# Patient Record
Sex: Male | Born: 1960
Health system: Southern US, Community
[De-identification: ages and names within clinical notes are randomized; demographics above are authoritative.]

## PROBLEM LIST (undated history)

## (undated) DIAGNOSIS — R030 Elevated blood-pressure reading, without diagnosis of hypertension: Secondary | ICD-10-CM

## (undated) DIAGNOSIS — C801 Malignant (primary) neoplasm, unspecified: Secondary | ICD-10-CM

## (undated) DIAGNOSIS — E7800 Pure hypercholesterolemia, unspecified: Secondary | ICD-10-CM

## (undated) DIAGNOSIS — K649 Unspecified hemorrhoids: Secondary | ICD-10-CM

## (undated) DIAGNOSIS — J45909 Unspecified asthma, uncomplicated: Secondary | ICD-10-CM

## (undated) DIAGNOSIS — L309 Dermatitis, unspecified: Secondary | ICD-10-CM

## (undated) HISTORY — DX: Elevated blood-pressure reading, without diagnosis of hypertension: R03.0

## (undated) HISTORY — DX: Dermatitis, unspecified: L30.9

## (undated) HISTORY — PX: PROSTATE BIOPSY: SHX241

## (undated) HISTORY — DX: Pure hypercholesterolemia, unspecified: E78.00

## (undated) HISTORY — PX: OTHER SURGICAL HISTORY: SHX169

## (undated) HISTORY — DX: Unspecified hemorrhoids: K64.9

---

## 2003-11-26 ENCOUNTER — Ambulatory Visit (HOSPITAL_COMMUNITY): Admission: RE | Admit: 2003-11-26 | Discharge: 2003-11-26 | Payer: Self-pay | Admitting: Allergy

## 2007-04-27 ENCOUNTER — Ambulatory Visit (HOSPITAL_COMMUNITY): Admission: RE | Admit: 2007-04-27 | Discharge: 2007-04-27 | Payer: Self-pay | Admitting: Surgery

## 2007-04-27 ENCOUNTER — Encounter (INDEPENDENT_AMBULATORY_CARE_PROVIDER_SITE_OTHER): Payer: Self-pay | Admitting: Surgery

## 2010-08-13 NOTE — Op Note (Signed)
NAMETANVIR, David Olsen                ACCOUNT NO.:  [id number]   MEDICAL RECORD NO.:  [id number]          PATIENT TYPE:  AMB   LOCATION:  DAY                          FACILITY:  [facility]   PHYSICIAN:  Wilmon Arms. Corliss Skains, M.D. DATE OF BIRTH:  [birth year]   DATE OF PROCEDURE:  04/27/2007  DATE OF DISCHARGE:                               OPERATIVE REPORT   PREOPERATIVE DIAGNOSIS:  External hemorrhoidal skin tag.   POSTOPERATIVE DIAGNOSIS:  External hemorrhoidal skin tag.   PROCEDURE:  Excision of external hemorrhoidal skin tag.   SURGEON:  Wilmon Arms. Corliss Skains, M.D., FACS   ANESTHESIA:  General.   INDICATIONS:  The patient is a 50 year old male who previously had a  large external hemorrhoid.  This has developed into a large external  skin tag which interferes with his hygiene.  This causes some pruritus  ani when he has difficulty with hygiene.  He presents for elective  removal of the skin tag.   DESCRIPTION OF PROCEDURE:  The patient brought to the operating room,  placed in supine position on the operating table.  After an adequate  level of general anesthesia was obtained, the patient's legs were placed  in lithotomy position in yellow fin stirrups.  His perineum was prepped  with Betadine and draped in sterile fashion.  Time-out was taken assure  proper patient, proper procedure.  The area around the sphincter was  infiltrated with 10 mL of 0.25% Marcaine with epinephrine.  His anus was  then dilated up to three fingers.  I inserted a silver bullet retractor.  There was an obvious external hemorrhoidal skin tag anteriorly.  There  really seemed to be no other significant hemorrhoidal disease.  The skin  tag was grasped with forceps and was amputated at its base.  The mucosal  edges were reapproximated with a figure-of-eight 3-0 Vicryl.  A small  piece of Gelfoam was packed in the anal canal.  Retractor was removed.  The patient was then extubated, brought to recovery room in stable  condition.      Wilmon Arms. Tsuei, M.D.  Electronically Signed     MKT/MEDQ  D:  04/27/2007  T:  04/27/2007  Job:  [id number]   cc:   Vikki Ports, M.D.  Fax: [phone]

## 2011-04-09 ENCOUNTER — Other Ambulatory Visit: Payer: Self-pay | Admitting: Allergy

## 2011-04-09 ENCOUNTER — Ambulatory Visit
Admission: RE | Admit: 2011-04-09 | Discharge: 2011-04-09 | Disposition: A | Discharge status: Home / Self Care | Payer: BC Managed Care – PPO | Source: Ambulatory Visit | Attending: Allergy | Admitting: Allergy

## 2011-04-09 DIAGNOSIS — J209 Acute bronchitis, unspecified: Secondary | ICD-10-CM

## 2013-09-01 ENCOUNTER — Inpatient Hospital Stay (HOSPITAL_COMMUNITY)
Admission: EM | Admit: 2013-09-01 | Discharge: 2013-09-04 | DRG: 373 | Disposition: A | Discharge status: Home / Self Care | Payer: BC Managed Care – PPO | Attending: Surgery | Admitting: Surgery

## 2013-09-01 ENCOUNTER — Emergency Department (HOSPITAL_COMMUNITY): Payer: BC Managed Care – PPO

## 2013-09-01 ENCOUNTER — Encounter (HOSPITAL_COMMUNITY): Payer: Self-pay | Admitting: Emergency Medicine

## 2013-09-01 DIAGNOSIS — K35209 Acute appendicitis with generalized peritonitis, without abscess, unspecified as to perforation: Principal | ICD-10-CM | POA: Diagnosis present

## 2013-09-01 DIAGNOSIS — Z8249 Family history of ischemic heart disease and other diseases of the circulatory system: Secondary | ICD-10-CM

## 2013-09-01 DIAGNOSIS — Z79899 Other long term (current) drug therapy: Secondary | ICD-10-CM

## 2013-09-01 DIAGNOSIS — K3532 Acute appendicitis with perforation and localized peritonitis, without abscess: Secondary | ICD-10-CM | POA: Diagnosis present

## 2013-09-01 DIAGNOSIS — J45909 Unspecified asthma, uncomplicated: Secondary | ICD-10-CM | POA: Diagnosis present

## 2013-09-01 DIAGNOSIS — K352 Acute appendicitis with generalized peritonitis, without abscess: Principal | ICD-10-CM | POA: Diagnosis present

## 2013-09-01 DIAGNOSIS — F17200 Nicotine dependence, unspecified, uncomplicated: Secondary | ICD-10-CM | POA: Diagnosis present

## 2013-09-01 HISTORY — DX: Unspecified asthma, uncomplicated: J45.909

## 2013-09-01 LAB — CBC WITH DIFFERENTIAL/PLATELET
Basophils Absolute: 0.1 10*3/uL (ref 0.0–0.1)
Basophils Relative: 1 % (ref 0–1)
EOS PCT: 6 % — AB (ref 0–5)
Eosinophils Absolute: 0.5 10*3/uL (ref 0.0–0.7)
HCT: 42.6 % (ref 39.0–52.0)
Hemoglobin: 14.5 g/dL (ref 13.0–17.0)
LYMPHS ABS: 1.5 10*3/uL (ref 0.7–4.0)
LYMPHS PCT: 15 % (ref 12–46)
MCH: 32.6 pg (ref 26.0–34.0)
MCHC: 34 g/dL (ref 30.0–36.0)
MCV: 95.7 fL (ref 78.0–100.0)
MONO ABS: 1.8 10*3/uL — AB (ref 0.1–1.0)
Monocytes Relative: 19 % — ABNORMAL HIGH (ref 3–12)
NEUTROS ABS: 5.9 10*3/uL (ref 1.7–7.7)
Neutrophils Relative %: 59 % (ref 43–77)
PLATELETS: 242 10*3/uL (ref 150–400)
RBC: 4.45 MIL/uL (ref 4.22–5.81)
RDW: 12.2 % (ref 11.5–15.5)
WBC: 9.8 10*3/uL (ref 4.0–10.5)

## 2013-09-01 LAB — COMPREHENSIVE METABOLIC PANEL
ALT: 11 U/L (ref 0–53)
AST: 16 U/L (ref 0–37)
Albumin: 3.4 g/dL — ABNORMAL LOW (ref 3.5–5.2)
Alkaline Phosphatase: 63 U/L (ref 39–117)
BILIRUBIN TOTAL: 0.4 mg/dL (ref 0.3–1.2)
BUN: 11 mg/dL (ref 6–23)
CHLORIDE: 99 meq/L (ref 96–112)
CO2: 24 mEq/L (ref 19–32)
Calcium: 9.2 mg/dL (ref 8.4–10.5)
Creatinine, Ser: 0.95 mg/dL (ref 0.50–1.35)
GLUCOSE: 126 mg/dL — AB (ref 70–99)
Potassium: 4 mEq/L (ref 3.7–5.3)
SODIUM: 136 meq/L — AB (ref 137–147)
TOTAL PROTEIN: 6.9 g/dL (ref 6.0–8.3)

## 2013-09-01 LAB — URINALYSIS, ROUTINE W REFLEX MICROSCOPIC
BILIRUBIN URINE: NEGATIVE
Glucose, UA: NEGATIVE mg/dL
HGB URINE DIPSTICK: NEGATIVE
KETONES UR: NEGATIVE mg/dL
Leukocytes, UA: NEGATIVE
NITRITE: NEGATIVE
PH: 6.5 (ref 5.0–8.0)
Protein, ur: NEGATIVE mg/dL
Specific Gravity, Urine: 1.004 — ABNORMAL LOW (ref 1.005–1.030)
Urobilinogen, UA: 0.2 mg/dL (ref 0.0–1.0)

## 2013-09-01 LAB — LIPASE, BLOOD: LIPASE: 12 U/L (ref 11–59)

## 2013-09-01 MED ORDER — MOMETASONE FURO-FORMOTEROL FUM 100-5 MCG/ACT IN AERO
2.0000 | INHALATION_SPRAY | Freq: Two times a day (BID) | RESPIRATORY_TRACT | Status: DC
  Administered 2013-09-01 – 2013-09-04 (×6): 2 via RESPIRATORY_TRACT
  Filled 2013-09-01: qty 8.8

## 2013-09-01 MED ORDER — SODIUM CHLORIDE 0.9 % IV BOLUS (SEPSIS)
1000.0000 mL | Freq: Once | INTRAVENOUS | Status: AC
  Administered 2013-09-01: 1000 mL via INTRAVENOUS

## 2013-09-01 MED ORDER — ENOXAPARIN SODIUM 40 MG/0.4ML ~~LOC~~ SOLN
40.0000 mg | SUBCUTANEOUS | Status: DC
  Administered 2013-09-01 – 2013-09-03 (×3): 40 mg via SUBCUTANEOUS
  Filled 2013-09-01 (×4): qty 0.4

## 2013-09-01 MED ORDER — ONDANSETRON HCL 4 MG/2ML IJ SOLN: 4.0000 mg | Freq: Four times a day (QID) | INTRAMUSCULAR | Status: DC | PRN

## 2013-09-01 MED ORDER — KCL-LACTATED RINGERS-D5W 20 MEQ/L IV SOLN
INTRAVENOUS | Status: DC
  Administered 2013-09-01: 21:00:00 via INTRAVENOUS
  Administered 2013-09-02: 1000 mL via INTRAVENOUS
  Administered 2013-09-04: 03:00:00 via INTRAVENOUS
  Filled 2013-09-01 (×6): qty 1000

## 2013-09-01 MED ORDER — PIPERACILLIN-TAZOBACTAM 3.375 G IVPB 30 MIN
3.3750 g | Freq: Once | INTRAVENOUS | Status: AC
  Administered 2013-09-01: 3.375 g via INTRAVENOUS
  Filled 2013-09-01: qty 50

## 2013-09-01 MED ORDER — IOHEXOL 300 MG/ML  SOLN
100.0000 mL | Freq: Once | INTRAMUSCULAR | Status: AC | PRN
  Administered 2013-09-01: 100 mL via INTRAVENOUS

## 2013-09-01 MED ORDER — MORPHINE SULFATE 2 MG/ML IJ SOLN: 2.0000 mg | INTRAMUSCULAR | Status: DC | PRN

## 2013-09-01 MED ORDER — ONDANSETRON HCL 4 MG/2ML IJ SOLN: 4.0000 mg | Freq: Three times a day (TID) | INTRAMUSCULAR | Status: DC | PRN

## 2013-09-01 MED ORDER — IOHEXOL 300 MG/ML  SOLN
50.0000 mL | Freq: Once | INTRAMUSCULAR | Status: AC | PRN
  Administered 2013-09-01: 50 mL via ORAL

## 2013-09-01 MED ORDER — PIPERACILLIN-TAZOBACTAM 3.375 G IVPB
3.3750 g | Freq: Three times a day (TID) | INTRAVENOUS | Status: DC
  Administered 2013-09-02 – 2013-09-04 (×7): 3.375 g via INTRAVENOUS
  Filled 2013-09-01 (×9): qty 50

## 2013-09-01 MED ORDER — PANTOPRAZOLE SODIUM 40 MG IV SOLR
40.0000 mg | Freq: Every day | INTRAVENOUS | Status: DC
Start: 2013-09-01 — End: 2013-09-04
  Administered 2013-09-02 – 2013-09-03 (×3): 40 mg via INTRAVENOUS
  Filled 2013-09-01 (×4): qty 40

## 2013-09-01 NOTE — H&P (Signed)
David Olsen is an 53 y.o. male.   Chief Complaint: Persistent right lower quadrant abdominal pain HPI: He was in his normal state of health until 4 days ago when he felt a little bit of bloating and had 2 loose bowel movements. He awoke the morning after this with crampy lower abdominal pain that eventually radiated to the right lower quadrant and increased in intensity. He took some Imodium and this seemed to help some.  2 days ago, the pain became worse and was associated with some chills and a subjective fever. No diarrhea. Mild discomfort with urination. Yesterday, he started to feel better but did arrange to go see his primary care physician today who was concerned about appendicitis. White blood cell count was normal in the office. CT scan demonstrated a right lower quadrant inflammatory phlegmon with the appendix in the middle of it suggesting a contained perforated appendicitis. He is not in severe pain at this time. He has been eating some.    Past Medical History  Diagnosis Date  . Asthma     History reviewed. No pertinent past surgical history.  Family History  Problem Relation Age of Onset  . Cancer Mother   . Coronary artery disease Father    Social History:  reports that he has been smoking Cigars.  He does not have any smokeless tobacco history on file. He reports that he drinks alcohol. His drug history is not on file.  Allergies: No Known Allergies  Prior to Admission medications   Medication Sig Start Date End Date Taking? Authorizing Provider  fexofenadine (ALLEGRA) 180 MG tablet Take 180 mg by mouth at bedtime.   Yes Historical Provider, MD  Fluticasone-Salmeterol (ADVAIR) 250-50 MCG/DOSE AEPB Inhale 1 puff into the lungs 2 (two) times daily.   Yes Historical Provider, MD  glucosamine-chondroitin 500-400 MG tablet Take 1 tablet by mouth daily.   Yes Historical Provider, MD  Multiple Vitamins-Minerals (MULTIVITAMIN WITH MINERALS) tablet Take 1 tablet by mouth daily.    Yes Historical Provider, MD  omega-3 acid ethyl esters (LOVAZA) 1 G capsule Take 1 g by mouth daily.   Yes Historical Provider, MD  Probiotic Product (PROBIOTIC DAILY PO) Take 1 tablet by mouth daily.   Yes Historical Provider, MD      (Not in a hospital admission)  Results for orders placed during the hospital encounter of 09/01/13 (from the past 48 hour(s))  CBC WITH DIFFERENTIAL     Status: Abnormal   Collection Time    09/01/13  4:15 PM      Result Value Ref Range   WBC 9.8  4.0 - 10.5 K/uL   RBC 4.45  4.22 - 5.81 MIL/uL   Hemoglobin 14.5  13.0 - 17.0 g/dL   HCT 42.6  39.0 - 52.0 %   MCV 95.7  78.0 - 100.0 fL   MCH 32.6  26.0 - 34.0 pg   MCHC 34.0  30.0 - 36.0 g/dL   RDW 12.2  11.5 - 15.5 %   Platelets 242  150 - 400 K/uL   Neutrophils Relative % 59  43 - 77 %   Neutro Abs 5.9  1.7 - 7.7 K/uL   Lymphocytes Relative 15  12 - 46 %   Lymphs Abs 1.5  0.7 - 4.0 K/uL   Monocytes Relative 19 (*) 3 - 12 %   Monocytes Absolute 1.8 (*) 0.1 - 1.0 K/uL   Eosinophils Relative 6 (*) 0 - 5 %   Eosinophils Absolute 0.5  0.0 - 0.7 K/uL   Basophils Relative 1  0 - 1 %   Basophils Absolute 0.1  0.0 - 0.1 K/uL  COMPREHENSIVE METABOLIC PANEL     Status: Abnormal   Collection Time    09/01/13  4:15 PM      Result Value Ref Range   Sodium 136 (*) 137 - 147 mEq/L   Potassium 4.0  3.7 - 5.3 mEq/L   Chloride 99  96 - 112 mEq/L   CO2 24  19 - 32 mEq/L   Glucose, Bld 126 (*) 70 - 99 mg/dL   BUN 11  6 - 23 mg/dL   Creatinine, Ser 0.95  0.50 - 1.35 mg/dL   Calcium 9.2  8.4 - 10.5 mg/dL   Total Protein 6.9  6.0 - 8.3 g/dL   Albumin 3.4 (*) 3.5 - 5.2 g/dL   AST 16  0 - 37 U/L   ALT 11  0 - 53 U/L   Alkaline Phosphatase 63  39 - 117 U/L   Total Bilirubin 0.4  0.3 - 1.2 mg/dL   GFR calc non Af Amer >90  >90 mL/min   GFR calc Af Amer >90  >90 mL/min   Comment: (NOTE)     The eGFR has been calculated using the CKD EPI equation.     This calculation has not been validated in all clinical  situations.     eGFR's persistently <90 mL/min signify possible Chronic Kidney     Disease.  LIPASE, BLOOD     Status: None   Collection Time    09/01/13  4:15 PM      Result Value Ref Range   Lipase 12  11 - 59 U/L  URINALYSIS, ROUTINE W REFLEX MICROSCOPIC     Status: Abnormal   Collection Time    09/01/13  5:00 PM      Result Value Ref Range   Color, Urine YELLOW  YELLOW   APPearance CLEAR  CLEAR   Specific Gravity, Urine 1.004 (*) 1.005 - 1.030   pH 6.5  5.0 - 8.0   Glucose, UA NEGATIVE  NEGATIVE mg/dL   Hgb urine dipstick NEGATIVE  NEGATIVE   Bilirubin Urine NEGATIVE  NEGATIVE   Ketones, ur NEGATIVE  NEGATIVE mg/dL   Protein, ur NEGATIVE  NEGATIVE mg/dL   Urobilinogen, UA 0.2  0.0 - 1.0 mg/dL   Nitrite NEGATIVE  NEGATIVE   Leukocytes, UA NEGATIVE  NEGATIVE   Comment: MICROSCOPIC NOT DONE ON URINES WITH NEGATIVE PROTEIN, BLOOD, LEUKOCYTES, NITRITE, OR GLUCOSE <1000 mg/dL.   Ct Abdomen Pelvis W Contrast  09/01/2013   CLINICAL DATA:  53 year old male with right abdominal and pelvic pain  EXAM: CT ABDOMEN AND PELVIS WITH CONTRAST  TECHNIQUE: Multidetector CT imaging of the abdomen and pelvis was performed using the standard protocol following bolus administration of intravenous contrast.  CONTRAST:  100 cc intravenous Omnipaque 300  COMPARISON:  None.  FINDINGS: A thickened appendix is present with moderate-large amount of adjacent inflammation/ possible fluid.  The terminal ileum is unremarkable.  There is no evidence of bowel obstruction or pneumoperitoneum.  Cyst at the dome of the liver is noted.  The spleen, pancreas, gallbladder, adrenal glands and kidneys are unremarkable except for a right upper pole renal scar.  The bladder is unremarkable.  No other bowel abnormalities are identified.  No acute or suspicious bony abnormalities are present.  IMPRESSION: Thickened appendix with moderate-large amount of adjacent inflammation/possible fluid compatible with appendicitis with possible  rupture.  No evidence of pneumoperitoneum or bowel obstruction.   Electronically Signed   By: Hassan Rowan M.D.   On: 09/01/2013 18:11    Review of Systems  Constitutional: Positive for fever, chills and malaise/fatigue.  HENT: Negative.   Respiratory: Negative.   Cardiovascular: Negative.   Gastrointestinal: Positive for abdominal pain. Negative for blood in stool.  Genitourinary: Positive for dysuria. Negative for hematuria.  Musculoskeletal: Positive for back pain.  Endo/Heme/Allergies: Does not bruise/bleed easily.    Blood pressure 147/80, pulse 61, temperature 98.3 F (36.8 C), temperature source Oral, resp. rate 16, SpO2 99.00%. Physical Exam  Constitutional: He appears well-developed and well-nourished. No distress.  HENT:  Head: Normocephalic and atraumatic.  Eyes: EOM are normal. No scleral icterus.  Neck: Neck supple.  Cardiovascular: Normal rate and regular rhythm.   Respiratory: Effort normal and breath sounds normal.  GI: Soft. Bowel sounds are normal. He exhibits no mass. There is tenderness (Mild in RLQ). There is no guarding.  Musculoskeletal: He exhibits no edema.  Lymphadenopathy:    He has no cervical adenopathy.  Neurological: He is alert.  Skin: Skin is warm and dry.  Psychiatric: He has a normal mood and affect. His behavior is normal.     Assessment/Plan Contained, perforated appendicitis versus perforated cecal diverticulitis or tumor. I favor the perforated, contained appendicitis. He has no signs of peritonitis or systemic inflammatory response.  Plan: Admit to the hospital. Broad-spectrum IV antibiotics. Interval repeat CT. If he responds to antibiotic treatment, would wait 6-8 weeks and perform a colonoscopy and potentially discuss interval appendectomy.  Rhunette Croft Evaluna Utke 09/01/2013, 7:36 PM

## 2013-09-01 NOTE — ED Notes (Signed)
Provided Pt with urinal and explained we needed a sample. Asked pt with notify when he voided

## 2013-09-01 NOTE — ED Notes (Signed)
Nicole PA at bedside   

## 2013-09-01 NOTE — ED Provider Notes (Signed)
CSN: [id number]     Arrival date & time 09/01/13  1525 History   First MD Initiated Contact with Patient 09/01/13 1534     Chief Complaint  Patient presents with  . Abdominal Pain    lower r/abdominal pain, decreased from Tuesday     (Consider location/radiation/quality/duration/timing/severity/associated sxs/prior Treatment) HPI  David Olsen is a 53 y.o. male complaining of right-sided abdominal pain onsets 2 days ago 9/10 at worst associated with reduced by mouth intake and tactile fever, pain has improved over the course of 2 days and is now minimal. Sent by primary care from the goal for appendicitis evaluation, mild white blood cell count of 10.6. Patient denies any shortness of breath change in bladder habits, he does have a normal bowel movement however he is reduced by mouth intake. Last oral intake was lunch at 12:30. No prior abdominal surgeries.   Past Medical History  Diagnosis Date  . Asthma    History reviewed. No pertinent past surgical history. Family History  Problem Relation Age of Onset  . Cancer Mother   . Coronary artery disease Father    History  Substance Use Topics  . Smoking status: Light Tobacco Smoker    Types: Cigars  . Smokeless tobacco: Not on file  . Alcohol Use: Yes     Comment: occ    Review of Systems  10 systems reviewed and found to be negative, except as noted in the HPI.  Allergies  Review of patient's allergies indicates no known allergies.  Home Medications   Prior to Admission medications   Medication Sig Start Date End Date Taking? Authorizing Provider  fexofenadine (ALLEGRA) 180 MG tablet Take 180 mg by mouth at bedtime.   Yes Historical Provider, MD  Fluticasone-Salmeterol (ADVAIR) 250-50 MCG/DOSE AEPB Inhale 1 puff into the lungs 2 (two) times daily.   Yes Historical Provider, MD  glucosamine-chondroitin 500-400 MG tablet Take 1 tablet by mouth daily.   Yes Historical Provider, MD  Multiple Vitamins-Minerals (MULTIVITAMIN  WITH MINERALS) tablet Take 1 tablet by mouth daily.   Yes Historical Provider, MD  omega-3 acid ethyl esters (LOVAZA) 1 G capsule Take 1 g by mouth daily.   Yes Historical Provider, MD  Probiotic Product (PROBIOTIC DAILY PO) Take 1 tablet by mouth daily.   Yes Historical Provider, MD   BP 147/80  Pulse 61  Temp(Src) 98.3 F (36.8 C) (Oral)  Resp 16  SpO2 99% Physical Exam  Nursing note and vitals reviewed. Constitutional: He is oriented to person, place, and time. He appears well-developed and well-nourished. No distress.  Healthy  HENT:  Head: Normocephalic and atraumatic.  Mouth/Throat: Oropharynx is clear and moist.  Eyes: Conjunctivae and EOM are normal. Pupils are equal, round, and reactive to light.  Neck: Normal range of motion. Neck supple.  Cardiovascular: Normal rate, regular rhythm and intact distal pulses.   Pulmonary/Chest: Effort normal and breath sounds normal. No stridor. No respiratory distress. He has no wheezes. He has no rales. He exhibits no tenderness.  Abdominal: Soft. He exhibits no distension and no mass. There is tenderness. There is rebound and guarding.  Hyperactive bowel sounds, tender in the right lower quadrant, Rovsing is positive, but psoas and obturator are negative. Mild voluntary guarding  Musculoskeletal: Normal range of motion.  Neurological: He is alert and oriented to person, place, and time.  Psychiatric: He has a normal mood and affect.    ED Course  Procedures (including critical care time) Labs Review Labs Reviewed  CBC WITH DIFFERENTIAL - Abnormal; Notable for the following:    Monocytes Relative 19 (*)    Monocytes Absolute 1.8 (*)    Eosinophils Relative 6 (*)    All other components within normal limits  COMPREHENSIVE METABOLIC PANEL - Abnormal; Notable for the following:    Sodium 136 (*)    Glucose, Bld 126 (*)    Albumin 3.4 (*)    All other components within normal limits  URINALYSIS, ROUTINE W REFLEX MICROSCOPIC -  Abnormal; Notable for the following:    Specific Gravity, Urine 1.004 (*)    All other components within normal limits  LIPASE, BLOOD    Imaging Review Ct Abdomen Pelvis W Contrast  09/01/2013   CLINICAL DATA:  53 year old male with right abdominal and pelvic pain  EXAM: CT ABDOMEN AND PELVIS WITH CONTRAST  TECHNIQUE: Multidetector CT imaging of the abdomen and pelvis was performed using the standard protocol following bolus administration of intravenous contrast.  CONTRAST:  100 cc intravenous Omnipaque 300  COMPARISON:  None.  FINDINGS: A thickened appendix is present with moderate-large amount of adjacent inflammation/ possible fluid.  The terminal ileum is unremarkable.  There is no evidence of bowel obstruction or pneumoperitoneum.  Cyst at the dome of the liver is noted.  The spleen, pancreas, gallbladder, adrenal glands and kidneys are unremarkable except for a right upper pole renal scar.  The bladder is unremarkable.  No other bowel abnormalities are identified.  No acute or suspicious bony abnormalities are present.  IMPRESSION: Thickened appendix with moderate-large amount of adjacent inflammation/possible fluid compatible with appendicitis with possible rupture. No evidence of pneumoperitoneum or bowel obstruction.   Electronically Signed   By: Laveda Abbe M.D.   On: 09/01/2013 18:11     EKG Interpretation None      MDM   Final diagnoses:  Perforated appendicitis    Filed Vitals:   09/01/13 1536 09/01/13 1830  BP: 137/73 147/80  Pulse: 63 61  Temp: 98.6 F (37 C) 98.3 F (36.8 C)  TempSrc: Oral Oral  Resp: 20 16  SpO2: 99% 99%    Medications  piperacillin-tazobactam (ZOSYN) IVPB 3.375 g (3.375 g Intravenous New Bag/Given 09/01/13 1856)  ondansetron (ZOFRAN) injection 4 mg (not administered)  sodium chloride 0.9 % bolus 1,000 mL (0 mLs Intravenous Stopped 09/01/13 1734)  iohexol (OMNIPAQUE) 300 MG/ML solution 50 mL (50 mLs Oral Contrast Given 09/01/13 1633)  iohexol  (OMNIPAQUE) 300 MG/ML solution 100 mL (100 mLs Intravenous Contrast Given 09/01/13 1753)    David Olsen is a 53 y.o. male presenting with right lower quadrant pain worsening until there was reduction in his symptoms approximately a day ago patient sent for evaluation of appendicitis. Patient afebrile, mild white count at primary care of 10. Abdominal exam is benign with tenderness palpation of right lower quadrant positive Rovsing with mild voluntary guarding. No white count on our blood testing. CT abdomen and pelvis shows phlegmon consistent with a perforated appendicitis. Gen. surgery consult from Dr. Abbey Chatters appreciated: Recommend starting him on Zosyn and surgery will admit.       Wynetta Emery, PA-C 09/01/13 1903

## 2013-09-01 NOTE — ED Notes (Signed)
Sent to ED by Dr. Zoll[facility] for possible appendicitis. Pain started 3 days ago an radiated across lower abdomin, pain increased on Tuesday. Pt currently reporting decreased intensity,isolated to right lower abdomin. Pt alert, oriented and cooperative. Pain 1 or 2

## 2013-09-01 NOTE — ED Notes (Addendum)
Romeo Apple MD notified of CT results. Romeo Apple MD at bedside.

## 2013-09-01 NOTE — ED Notes (Signed)
Pt returned from CT. Pt ambulated to restroom to void.

## 2013-09-01 NOTE — ED Notes (Signed)
Private MD called to notify that pt could be potential appendicitis.  Dr. Denton Lank made aware.

## 2013-09-01 NOTE — ED Notes (Signed)
Pt denies need for medication at present time. Romeo Apple MD aware and at bedside to assess.

## 2013-09-02 LAB — CBC
HCT: 38.1 % — ABNORMAL LOW (ref 39.0–52.0)
Hemoglobin: 12.7 g/dL — ABNORMAL LOW (ref 13.0–17.0)
MCH: 32.4 pg (ref 26.0–34.0)
MCHC: 33.3 g/dL (ref 30.0–36.0)
MCV: 97.2 fL (ref 78.0–100.0)
PLATELETS: 232 10*3/uL (ref 150–400)
RBC: 3.92 MIL/uL — ABNORMAL LOW (ref 4.22–5.81)
RDW: 12.2 % (ref 11.5–15.5)
WBC: 7.9 10*3/uL (ref 4.0–10.5)

## 2013-09-02 NOTE — Progress Notes (Signed)
CARE MANAGEMENT NOTE 09/02/2013  Patient:  David Olsen, David Olsen   Account Number:  [id number]  Date Initiated:  09/02/2013  Documentation initiated by:  Jiles Crocker  Subjective/Objective Assessment:   ADMITTED WITH ABDOMINAL PAIN     Action/Plan:   CM FOLLOWING FOR DCP   Anticipated DC Date:  09/08/2013   Anticipated DC Plan:  HOME/SELF CARE      DC Planning Services  CM consult           Status of service:  In process, will continue to follow Medicare Important Message given?   (If response is "NO", the following Medicare IM given date fields will be blank)  Per UR Regulation:  Reviewed for med. necessity/level of care/duration of stay  Comments:  6/5/2015Abelino Derrick RN,BSN,MHA [phone]

## 2013-09-02 NOTE — Progress Notes (Signed)
Patient ID: CARLE LAQUE, male   DOB: 1960-08-02, 53 y.o.   MRN: [id number]    Subjective: Pt feels well today.  Essentially has no pain.  Tolerating clear liquids with no issues  Objective: Vital signs in last 24 hours: Temp:  [98.3 F (36.8 C)-99.4 F (37.4 C)] 98.5 F (36.9 C) (06/05 0732) Pulse Rate:  [60-63] 60 (06/05 0732) Resp:  [16-20] 16 (06/05 0732) BP: (128-147)/(73-80) 128/77 mmHg (06/05 0732) SpO2:  [97 %-99 %] 97 % (06/05 0732) Weight:  [153 lb (69.4 kg)] 153 lb (69.4 kg) (06/05 0013) Last BM Date: 08/30/13  Intake/Output from previous day: 06/04 0701 - 06/05 0700 In: 846.3 [P.O.:180; I.V.:616.3; IV Piggyback:50] Out: 500 [Urine:500] Intake/Output this shift:    PE: Abd: soft, minimally tender in RLQ, +BS, Nd Heart: regular Lungs: CTAB  Lab Results:   Recent Labs  09/01/13 1615 09/02/13 0319  WBC 9.8 7.9  HGB 14.5 12.7*  HCT 42.6 38.1*  PLT 242 232   BMET  Recent Labs  09/01/13 1615  NA 136*  K 4.0  CL 99  CO2 24  GLUCOSE 126*  BUN 11  CREATININE 0.95  CALCIUM 9.2   PT/INR No results found for this basename: LABPROT, INR,  in the last 72 hours CMP     Component Value Date/Time   NA 136* 09/01/2013 1615   K 4.0 09/01/2013 1615   CL 99 09/01/2013 1615   CO2 24 09/01/2013 1615   GLUCOSE 126* 09/01/2013 1615   BUN 11 09/01/2013 1615   CREATININE 0.95 09/01/2013 1615   CALCIUM 9.2 09/01/2013 1615   PROT 6.9 09/01/2013 1615   ALBUMIN 3.4* 09/01/2013 1615   AST 16 09/01/2013 1615   ALT 11 09/01/2013 1615   ALKPHOS 63 09/01/2013 1615   BILITOT 0.4 09/01/2013 1615   GFRNONAA >90 09/01/2013 1615   GFRAA >90 09/01/2013 1615   Lipase     Component Value Date/Time   LIPASE 12 09/01/2013 1615       Studies/Results: Ct Abdomen Pelvis W Contrast  09/01/2013   CLINICAL DATA:  53 year old male with right abdominal and pelvic pain  EXAM: CT ABDOMEN AND PELVIS WITH CONTRAST  TECHNIQUE: Multidetector CT imaging of the abdomen and pelvis was performed using the standard  protocol following bolus administration of intravenous contrast.  CONTRAST:  100 cc intravenous Omnipaque 300  COMPARISON:  None.  FINDINGS: A thickened appendix is present with moderate-large amount of adjacent inflammation/ possible fluid.  The terminal ileum is unremarkable.  There is no evidence of bowel obstruction or pneumoperitoneum.  Cyst at the dome of the liver is noted.  The spleen, pancreas, gallbladder, adrenal glands and kidneys are unremarkable except for a right upper pole renal scar.  The bladder is unremarkable.  No other bowel abnormalities are identified.  No acute or suspicious bony abnormalities are present.  IMPRESSION: Thickened appendix with moderate-large amount of adjacent inflammation/possible fluid compatible with appendicitis with possible rupture. No evidence of pneumoperitoneum or bowel obstruction.   Electronically Signed   By: Laveda Abbe M.D.   On: 09/01/2013 18:11    Anti-infectives: Anti-infectives   Start     Dose/Rate Route Frequency Ordered Stop   09/02/13 0400  piperacillin-tazobactam (ZOSYN) IVPB 3.375 g     3.375 g 12.5 mL/hr over 240 Minutes Intravenous Every 8 hours 09/01/13 1936     09/01/13 1900  piperacillin-tazobactam (ZOSYN) IVPB 3.375 g     3.375 g 100 mL/hr over 30 Minutes Intravenous  Once  09/01/13 1851 09/01/13 2024     Assessment/Plan  1. Acute perforated appendicitis  Plan: 1. Cont IV abx therapy 2. Cont clear liquids for today 3. Plan outpatient c-scope and interval appendectomy in the future.   LOS: 1 day   Letha Cape 09/02/2013, 12:17 PM Pager: [phone]  Agree with above. Doing well.  Leave on clear liquids until Sunday - then advance diet and home.  Ovidio Kin, MD, [facility] Pager: [phone] Office phone:  [phone]

## 2013-09-02 NOTE — Progress Notes (Signed)
Nutrition Brief Note  Patient identified on the Malnutrition Screening Tool (MST) Report  Wt Readings from Last 15 Encounters:  09/02/13 153 lb (69.4 kg)    Body mass index is 22.28 kg/(m^2). Patient meets criteria for Normal weight based on current BMI.   Current diet order is Clear liquid, patient is consuming approximately >75% of meals at this time. Labs and medications reviewed.   Pt endorsed an unintentional weight loss of 6-8 lbs since 10/2012 (4% body weight, non-severe for time frame) Has been implementing healthier choices and increasing physical activity. Had slight decreased appetite in past 4 days d/t abd pain/bloating, but still able to tolerate three smaller meals. Tolerating clear liquid diet, eager for diet advancement.   No nutrition interventions warranted at this time. If nutrition issues arise, please consult RD.   Lloyd Huger MS RD LDN Clinical Dietitian [phone]

## 2013-09-02 NOTE — ED Provider Notes (Signed)
Medical screening examination/treatment/procedure(s) were conducted as a shared visit with non-physician practitioner(s) and myself.  I personally evaluated the patient during the encounter.   EKG Interpretation None      I interviewed and examined the patient. Lungs are CTAB. Cardiac exam wnl. Abdomen soft, mild ttp of RLQ. Found to have appendicitis. Admitted to [facility].   Junius Argyle, MD 09/02/13 [phone]

## 2013-09-03 LAB — GLUCOSE, CAPILLARY: GLUCOSE-CAPILLARY: 87 mg/dL (ref 70–99)

## 2013-09-03 NOTE — Plan of Care (Signed)
Problem: Phase II Progression Outcomes Goal: Progress activity as tolerated unless otherwise ordered Outcome: Progressing oob in room. Patient reports he will walk in hallway on 09/03/13

## 2013-09-03 NOTE — Plan of Care (Signed)
Problem: Phase III Progression Outcomes Goal: Pain controlled on oral analgesia Outcome: Not Applicable Date Met:  74/14/23 Patient reports mild abdomen cramping at times. Refuses need for pain medication.

## 2013-09-03 NOTE — Progress Notes (Signed)
General Surgery Note  LOS: 2 days  POD -     Assessment/Plan: 1.  RLQ phlegmon - probable ruptured appendicitis.  Zosyn - 09/01/2013 >>>  Plan conservative therapy.  To leave on clear liquids today.  If he continues to do well, reg food tomorrow and probably home on oral antibiotics.   2.  DVT prophylaxis - Lovenox  Active Problems:   Perforated appendicitis  Subjective:  Doing well.  On clear liquids and doing well with this. Objective:   Filed Vitals:   09/03/13 0441  BP: 135/78  Pulse: 54  Temp: 97.9 F (36.6 C)  Resp: 16     Intake/Output from previous day:  06/05 0701 - 06/06 0700 In: 1636.3 [P.O.:720; I.V.:816.3; IV Piggyback:100] Out: -   Intake/Output this shift:      Physical Exam:   General: WN WM who is alert and oriented.    HEENT: Normal. Pupils equal. .   Lungs: Clear.   Abdomen: Benign.  Soft.  I feel no mass.     Lab Results:    Recent Labs  09/01/13 1615 09/02/13 0319  WBC 9.8 7.9  HGB 14.5 12.7*  HCT 42.6 38.1*  PLT 242 232    BMET   Recent Labs  09/01/13 1615  NA 136*  K 4.0  CL 99  CO2 24  GLUCOSE 126*  BUN 11  CREATININE 0.95  CALCIUM 9.2    PT/INR  No results found for this basename: LABPROT, INR,  in the last 72 hours  ABG  No results found for this basename: PHART, PCO2, PO2, HCO3,  in the last 72 hours   Studies/Results:  Ct Abdomen Pelvis W Contrast  09/01/2013   CLINICAL DATA:  53 year old male with right abdominal and pelvic pain  EXAM: CT ABDOMEN AND PELVIS WITH CONTRAST  TECHNIQUE: Multidetector CT imaging of the abdomen and pelvis was performed using the standard protocol following bolus administration of intravenous contrast.  CONTRAST:  100 cc intravenous Omnipaque 300  COMPARISON:  None.  FINDINGS: A thickened appendix is present with moderate-large amount of adjacent inflammation/ possible fluid.  The terminal ileum is unremarkable.  There is no evidence of bowel obstruction or pneumoperitoneum.  Cyst at the dome  of the liver is noted.  The spleen, pancreas, gallbladder, adrenal glands and kidneys are unremarkable except for a right upper pole renal scar.  The bladder is unremarkable.  No other bowel abnormalities are identified.  No acute or suspicious bony abnormalities are present.  IMPRESSION: Thickened appendix with moderate-large amount of adjacent inflammation/possible fluid compatible with appendicitis with possible rupture. No evidence of pneumoperitoneum or bowel obstruction.   Electronically Signed   By: Laveda Abbe M.D.   On: 09/01/2013 18:11     Anti-infectives:   Anti-infectives   Start     Dose/Rate Route Frequency Ordered Stop   09/02/13 0400  piperacillin-tazobactam (ZOSYN) IVPB 3.375 g     3.375 g 12.5 mL/hr over 240 Minutes Intravenous Every 8 hours 09/01/13 1936     09/01/13 1900  piperacillin-tazobactam (ZOSYN) IVPB 3.375 g     3.375 g 100 mL/hr over 30 Minutes Intravenous  Once 09/01/13 1851 09/01/13 2024      David Kin, MD, FACS Pager: [phone] [facility] Office: [phone] 09/03/2013

## 2013-09-04 MED ORDER — AMOXICILLIN-POT CLAVULANATE 875-125 MG PO TABS: 1.0000 | ORAL_TABLET | Freq: Two times a day (BID) | ORAL | Status: DC

## 2013-09-04 NOTE — Discharge Summary (Signed)
Physician Discharge Summary  Patient ID:  David Olsen  MRN: [id number]  DOB/AGE: May 04, 1960 53 y.o.  Admit date: 09/01/2013 Discharge date: 09/04/2013  Discharge Diagnoses:  1.  Probable appendiceal phlegmon  Active Problems:   Perforated appendicitis  Operation:  on * No surgery found *  Discharged Condition: good  Hospital Course: David Olsen is an 53 y.o. male whose primary care physician is No primary provider on file. and who was admitted 09/01/2013 with a chief complaint of  Chief Complaint  Patient presents with  . Abdominal Pain    lower r/abdominal pain, decreased from Tuesday   He was found on Abdominal CT scan on 09/01/2013 to have a phlegmon in the RLQ consistent with probable appendicitis.  He was seen originally by Dr. Abbey Chatters, who favored antibiotic treatement, interval repeat CT scan, probable colonoscopy, and discussion about an interval appendectomy.  He has done well on Zosyn, is afebrile, basically has no symptoms and is ready to go home.  The discharge instructions were reviewed with the patient.  Consults: None  Significant Diagnostic Studies: Results for orders placed during the hospital encounter of 09/01/13  CBC WITH DIFFERENTIAL      Result Value Ref Range   WBC 9.8  4.0 - 10.5 K/uL   RBC 4.45  4.22 - 5.81 MIL/uL   Hemoglobin 14.5  13.0 - 17.0 g/dL   HCT 41.3  24.4 - 01.0 %   MCV 95.7  78.0 - 100.0 fL   MCH 32.6  26.0 - 34.0 pg   MCHC 34.0  30.0 - 36.0 g/dL   RDW 27.2  53.6 - 64.4 %   Platelets 242  150 - 400 K/uL   Neutrophils Relative % 59  43 - 77 %   Neutro Abs 5.9  1.7 - 7.7 K/uL   Lymphocytes Relative 15  12 - 46 %   Lymphs Abs 1.5  0.7 - 4.0 K/uL   Monocytes Relative 19 (*) 3 - 12 %   Monocytes Absolute 1.8 (*) 0.1 - 1.0 K/uL   Eosinophils Relative 6 (*) 0 - 5 %   Eosinophils Absolute 0.5  0.0 - 0.7 K/uL   Basophils Relative 1  0 - 1 %   Basophils Absolute 0.1  0.0 - 0.1 K/uL  COMPREHENSIVE METABOLIC PANEL      Result Value Ref  Range   Sodium 136 (*) 137 - 147 mEq/L   Potassium 4.0  3.7 - 5.3 mEq/L   Chloride 99  96 - 112 mEq/L   CO2 24  19 - 32 mEq/L   Glucose, Bld 126 (*) 70 - 99 mg/dL   BUN 11  6 - 23 mg/dL   Creatinine, Ser 0.34  0.50 - 1.35 mg/dL   Calcium 9.2  8.4 - 74.2 mg/dL   Total Protein 6.9  6.0 - 8.3 g/dL   Albumin 3.4 (*) 3.5 - 5.2 g/dL   AST 16  0 - 37 U/L   ALT 11  0 - 53 U/L   Alkaline Phosphatase 63  39 - 117 U/L   Total Bilirubin 0.4  0.3 - 1.2 mg/dL   GFR calc non Af Amer >90  >90 mL/min   GFR calc Af Amer >90  >90 mL/min  URINALYSIS, ROUTINE W REFLEX MICROSCOPIC      Result Value Ref Range   Color, Urine YELLOW  YELLOW   APPearance CLEAR  CLEAR   Specific Gravity, Urine 1.004 (*) 1.005 - 1.030   pH 6.5  5.0 - 8.0   Glucose, UA NEGATIVE  NEGATIVE mg/dL   Hgb urine dipstick NEGATIVE  NEGATIVE   Bilirubin Urine NEGATIVE  NEGATIVE   Ketones, ur NEGATIVE  NEGATIVE mg/dL   Protein, ur NEGATIVE  NEGATIVE mg/dL   Urobilinogen, UA 0.2  0.0 - 1.0 mg/dL   Nitrite NEGATIVE  NEGATIVE   Leukocytes, UA NEGATIVE  NEGATIVE  LIPASE, BLOOD      Result Value Ref Range   Lipase 12  11 - 59 U/L  CBC      Result Value Ref Range   WBC 7.9  4.0 - 10.5 K/uL   RBC 3.92 (*) 4.22 - 5.81 MIL/uL   Hemoglobin 12.7 (*) 13.0 - 17.0 g/dL   HCT 16.1 (*) 09.6 - 04.5 %   MCV 97.2  78.0 - 100.0 fL   MCH 32.4  26.0 - 34.0 pg   MCHC 33.3  30.0 - 36.0 g/dL   RDW 40.9  81.1 - 91.4 %   Platelets 232  150 - 400 K/uL  GLUCOSE, CAPILLARY      Result Value Ref Range   Glucose-Capillary 87  70 - 99 mg/dL   Comment 1 Documented in Chart     Comment 2 Notify RN      Ct Abdomen Pelvis W Contrast  09/01/2013   CLINICAL DATA:  53 year old male with right abdominal and pelvic pain  EXAM: CT ABDOMEN AND PELVIS WITH CONTRAST  TECHNIQUE: Multidetector CT imaging of the abdomen and pelvis was performed using the standard protocol following bolus administration of intravenous contrast.  CONTRAST:  100 cc intravenous Omnipaque  300  COMPARISON:  None.  FINDINGS: A thickened appendix is present with moderate-large amount of adjacent inflammation/ possible fluid.  The terminal ileum is unremarkable.  There is no evidence of bowel obstruction or pneumoperitoneum.  Cyst at the dome of the liver is noted.  The spleen, pancreas, gallbladder, adrenal glands and kidneys are unremarkable except for a right upper pole renal scar.  The bladder is unremarkable.  No other bowel abnormalities are identified.  No acute or suspicious bony abnormalities are present.  IMPRESSION: Thickened appendix with moderate-large amount of adjacent inflammation/possible fluid compatible with appendicitis with possible rupture. No evidence of pneumoperitoneum or bowel obstruction.   Electronically Signed   By: Laveda Abbe M.D.   On: 09/01/2013 18:11    Discharge Exam:  Filed Vitals:   09/04/13 0520  BP: 126/77  Pulse: 52  Temp: 97.4 F (36.3 C)  Resp: 16    General: WN athletic WM who is alert and generally healthy appearing.  Lungs: Clear to auscultation and symmetric breath sounds. Heart:  RRR. No murmur or rub. Abdomen: Soft. No mass. No tenderness. Normal bowel sounds.   Discharge Medications:     Medication List         amoxicillin-clavulanate 875-125 MG per tablet  Commonly known as:  AUGMENTIN  Take 1 tablet by mouth 2 (two) times daily.     fexofenadine 180 MG tablet  Commonly known as:  ALLEGRA  Take 180 mg by mouth at bedtime.     Fluticasone-Salmeterol 250-50 MCG/DOSE Aepb  Commonly known as:  ADVAIR  Inhale 1 puff into the lungs 2 (two) times daily.     glucosamine-chondroitin 500-400 MG tablet  Take 1 tablet by mouth daily.     multivitamin with minerals tablet  Take 1 tablet by mouth daily.     omega-3 acid ethyl esters 1 G capsule  Commonly known as:  LOVAZA  Take 1 g by mouth daily.     PROBIOTIC DAILY PO  Take 1 tablet by mouth daily.        Disposition: Final discharge disposition not confirmed       Discharge Instructions   Diet - low sodium heart healthy    Complete by:  As directed      Increase activity slowly    Complete by:  As directed            Return to work on:  Tomorrow  Activity:  Driving - May drive   Lifting - No limit.  But take it easy for vigorous activity.  Diet:  As tolerated  Follow up appointment:  Call Dr. Maris Berger office [facility]) at [phone] for an appointment in 2 weeks.      The office will arrange an outpatient abdominal CT scan prior to seeing Dr. Abbey Chatters.  Medications and dosages:  Resume your home medications.  You have a prescription for:  Augmentin  Signed: Ovidio Kin, M.D., [facility] Office:  [phone]  09/04/2013, 9:53 AM

## 2013-09-04 NOTE — Discharge Instructions (Signed)
[facility] - DISCHARGE INSTRUCTIONS TO PATIENT  Return to work on:  Tomorrow  Activity:  Driving - May drive   Lifting - No limit.  But take it easy for vigorous activity.  Diet:  As tolerated  Follow up appointment:  Call Dr. Maris Berger office [facility]) at [phone] for an appointment in 2 weeks  Medications and dosages:  Resume your home medications.  You have a prescription for:  Augmentin  Call Dr. Abbey Chatters or his office  [phone]) if you have:  Temperature greater than 100.4,  Persistent nausea and vomiting,  Severe uncontrolled pain,  Redness, tenderness, or signs of infection (pain, swelling, redness, odor or green/yellow discharge around the site),  Difficulty breathing, headache or visual disturbances,  Any other questions or concerns you may have after discharge.  In an emergency, call 911 or go to an Emergency Department at a nearby hospital.

## 2013-09-04 NOTE — Progress Notes (Signed)
Patient discharged to home, discharge instructions reviewed with patient who verbalized understanding. New RX given to patient. Patient elected to walk down to his car.

## 2013-09-05 ENCOUNTER — Telehealth (INDEPENDENT_AMBULATORY_CARE_PROVIDER_SITE_OTHER): Payer: Self-pay

## 2013-09-05 ENCOUNTER — Other Ambulatory Visit (INDEPENDENT_AMBULATORY_CARE_PROVIDER_SITE_OTHER): Payer: Self-pay | Admitting: General Surgery

## 2013-09-05 DIAGNOSIS — K3532 Acute appendicitis with perforation and localized peritonitis, without abscess: Secondary | ICD-10-CM

## 2013-09-05 NOTE — Telephone Encounter (Signed)
Called and spoke to pt.  He asked if he could call me back to get this information regarding his appts.  I advised pt to call back and ask for the nursing office and it would be a detailed message in his chart.  Please advise pt of his f/u visit with Dr. Abbey Chatters below.  Also, advise pt of his CT Abd/Pelvis W Contrast, [employer], [address] 09-19-13, arriving at 8:15 a.m.Marland Kitchen  Also please let pt know NO solid foods 4 hours prior to his test.  He will need to go by [facility] and pick up his oral contrast.  Thanks!  Victorino Dike

## 2013-09-05 NOTE — Telephone Encounter (Signed)
Informed pt of message below. Pt verbalized understanding  

## 2013-09-05 NOTE — Telephone Encounter (Signed)
Pt's post op appointment is 09/21/13 at 11:40 a.m with Dr. Abbey Chatters.  Pt will need a CT abd/pelvis with contrast, diagnosis post op perforated appendicitis, prior to this appointment.  Please give pt instructions when he calls.

## 2013-09-05 NOTE — Progress Notes (Signed)
Discharge summary sent to payer through [facility]  

## 2013-09-19 ENCOUNTER — Ambulatory Visit
Admission: RE | Admit: 2013-09-19 | Discharge: 2013-09-19 | Disposition: A | Discharge status: Home / Self Care | Payer: BC Managed Care – PPO | Source: Ambulatory Visit | Attending: General Surgery | Admitting: General Surgery

## 2013-09-19 DIAGNOSIS — K3532 Acute appendicitis with perforation and localized peritonitis, without abscess: Secondary | ICD-10-CM

## 2013-09-19 MED ORDER — IOHEXOL 300 MG/ML  SOLN
100.0000 mL | Freq: Once | INTRAMUSCULAR | Status: AC | PRN
  Administered 2013-09-19: 100 mL via INTRAVENOUS

## 2013-09-21 ENCOUNTER — Encounter (INDEPENDENT_AMBULATORY_CARE_PROVIDER_SITE_OTHER): Payer: Self-pay | Admitting: General Surgery

## 2013-09-21 ENCOUNTER — Ambulatory Visit (INDEPENDENT_AMBULATORY_CARE_PROVIDER_SITE_OTHER): Payer: BC Managed Care – PPO | Admitting: General Surgery

## 2013-09-21 VITALS — BP 122/80 | HR 38 | Temp 97.3°F | Ht 69.0 in | Wt 154.0 lb

## 2013-09-21 DIAGNOSIS — K3589 Other acute appendicitis without perforation or gangrene: Secondary | ICD-10-CM

## 2013-09-21 DIAGNOSIS — K3580 Unspecified acute appendicitis: Secondary | ICD-10-CM

## 2013-09-21 NOTE — Patient Instructions (Addendum)
Call if you would like to schedule the surgery.  Asked for Bernie.  [phone]

## 2013-09-21 NOTE — Progress Notes (Signed)
Subjective:     Patient ID: David Olsen, male   DOB: May 28, 1960, 53 y.o.   MRN: [id number]  HPI  He is here for followup of his perforated appendicitis treated with antibiotic therapy. He's feeling much better. He had a repeat CT scan which demonstrated inflammatory changes were significantly less. Appendix is still somewhat thickened at 12 mm. No abscess. He's not having any fevers or chills. No significant pain.   Review of SystemsAs above     Objective:   Physical Exam General-he looks good and is in no acute distress.  Abdomen-soft, nontender, no palpable masses.    Assessment:     Perforated appendicitis status post treatment with antibiotic therapy. CT scan is significantly improved. He is doing well. No appendicolith seen on CT scan.     Plan:     We talked about options of no operation needed versus interval appendectomy. I explained to him that this is a change in our thinking over the past 5 years but nonoperative treatment has been successful in the majority of the patient's. It is also acceptable to proceed with interval appendectomy which he is leaning toward. He would like to speak with his wife and think about a couple days and then Collis back with his decision.   I have discussed the procedure and risks of appendectomy. The risks include but are not limited to bleeding, infection, wound problems, anesthesia, injury to intra-abdominal organs, possibility of postoperative ileus.

## 2013-09-28 ENCOUNTER — Telehealth (INDEPENDENT_AMBULATORY_CARE_PROVIDER_SITE_OTHER): Payer: Self-pay

## 2013-09-28 ENCOUNTER — Encounter (INDEPENDENT_AMBULATORY_CARE_PROVIDER_SITE_OTHER): Payer: Self-pay | Admitting: General Surgery

## 2013-09-28 NOTE — Progress Notes (Signed)
Patient ID: David GoingBarry C Aro, male   DOB: [birth year], 53 y.o.   MRN: [id number] He has decided that he wants to proceed with interval appendectomy.  Will work on getting this scheduled.

## 2013-09-28 NOTE — Telephone Encounter (Signed)
Pt called today and is ready to schedule surgery.  He has requested some time in August.  Dr. Abbey Chattersosenbower put orders in Epic and order sent to surgery scheduling.

## 2013-10-31 ENCOUNTER — Other Ambulatory Visit (INDEPENDENT_AMBULATORY_CARE_PROVIDER_SITE_OTHER): Payer: Self-pay | Admitting: General Surgery

## 2013-11-29 ENCOUNTER — Encounter (HOSPITAL_COMMUNITY): Payer: Self-pay | Admitting: Pharmacy Technician

## 2013-11-30 ENCOUNTER — Encounter (HOSPITAL_COMMUNITY): Payer: Self-pay | Admitting: *Deleted

## 2013-11-30 ENCOUNTER — Other Ambulatory Visit (HOSPITAL_COMMUNITY): Payer: Self-pay | Admitting: Anesthesiology

## 2013-12-01 ENCOUNTER — Encounter (HOSPITAL_COMMUNITY): Payer: Self-pay

## 2013-12-01 ENCOUNTER — Ambulatory Visit (HOSPITAL_COMMUNITY)
Admission: RE | Admit: 2013-12-01 | Discharge: 2013-12-01 | Disposition: A | Discharge status: Home / Self Care | Payer: BC Managed Care – PPO | Source: Ambulatory Visit | Attending: Anesthesiology | Admitting: Anesthesiology

## 2013-12-01 ENCOUNTER — Encounter (HOSPITAL_COMMUNITY)
Admission: RE | Admit: 2013-12-01 | Discharge: 2013-12-01 | Disposition: A | Discharge status: Home / Self Care | Payer: BC Managed Care – PPO | Source: Ambulatory Visit | Attending: General Surgery | Admitting: General Surgery

## 2013-12-01 DIAGNOSIS — J45909 Unspecified asthma, uncomplicated: Secondary | ICD-10-CM | POA: Insufficient documentation

## 2013-12-01 LAB — CBC WITH DIFFERENTIAL/PLATELET
Basophils Absolute: 0.1 10*3/uL (ref 0.0–0.1)
Basophils Relative: 1 % (ref 0–1)
EOS ABS: 0.3 10*3/uL (ref 0.0–0.7)
EOS PCT: 5 % (ref 0–5)
HCT: 43.5 % (ref 39.0–52.0)
HEMOGLOBIN: 14.2 g/dL (ref 13.0–17.0)
LYMPHS ABS: 1.7 10*3/uL (ref 0.7–4.0)
Lymphocytes Relative: 32 % (ref 12–46)
MCH: 31.8 pg (ref 26.0–34.0)
MCHC: 32.6 g/dL (ref 30.0–36.0)
MCV: 97.3 fL (ref 78.0–100.0)
MONOS PCT: 15 % — AB (ref 3–12)
Monocytes Absolute: 0.8 10*3/uL (ref 0.1–1.0)
Neutro Abs: 2.5 10*3/uL (ref 1.7–7.7)
Neutrophils Relative %: 47 % (ref 43–77)
Platelets: 230 10*3/uL (ref 150–400)
RBC: 4.47 MIL/uL (ref 4.22–5.81)
RDW: 12.8 % (ref 11.5–15.5)
WBC: 5.4 10*3/uL (ref 4.0–10.5)

## 2013-12-01 LAB — COMPREHENSIVE METABOLIC PANEL
ALK PHOS: 65 U/L (ref 39–117)
ALT: 18 U/L (ref 0–53)
ANION GAP: 11 (ref 5–15)
AST: 24 U/L (ref 0–37)
Albumin: 3.9 g/dL (ref 3.5–5.2)
BUN: 14 mg/dL (ref 6–23)
CALCIUM: 9.4 mg/dL (ref 8.4–10.5)
CO2: 28 mEq/L (ref 19–32)
Chloride: 102 mEq/L (ref 96–112)
Creatinine, Ser: 1.04 mg/dL (ref 0.50–1.35)
GFR calc non Af Amer: 79 mL/min — ABNORMAL LOW (ref >90)
GLUCOSE: 94 mg/dL (ref 70–99)
Potassium: 4.9 mEq/L (ref 3.7–5.3)
Sodium: 141 mEq/L (ref 137–147)
Total Bilirubin: 0.3 mg/dL (ref 0.3–1.2)
Total Protein: 6.8 g/dL (ref 6.0–8.3)

## 2013-12-01 LAB — PROTIME-INR
INR: 0.96 (ref 0.00–1.49)
Prothrombin Time: 12.8 seconds (ref 11.6–15.2)

## 2013-12-01 NOTE — Patient Instructions (Addendum)
David Olsen  12/01/2013   Your procedure is scheduled on: Friday September 11th, 2015  Report to [facility] Main Entrance and follow signs to  Short Stay Center at 530  AM.  Call this number if you have problems the morning of surgery [phone]   Remember:  Do not eat food or drink liquids :After Midnight.     Take these medicines the morning of surgery with A SIP OF WATER: advair, albuterol inhaler if needed, bring inhaler and leave with your wife.                               You may not have any metal on your body including hair pins and piercings  Do not wear jewelry, make-up, lotions, powders, or deodorant.   Men may shave face and neck.  Do not bring valuables to the hospital. [facility] IS NOT RESPONSIBLE FOR VALUABLES.  Contacts, dentures or bridgework may not be worn into surgery.  Leave suitcase in the car. After surgery it may be brought to your room.  For patients admitted to the hospital, checkout time is 11:00 AM the day of discharge.   Patients discharged the day of surgery will not be allowed to drive home.  Name and phone number of your driver:  Special Instructions: N/A ________________________________________________________________________  [facility] - Preparing for Surgery Before surgery, you can play an important role.  Because skin is not sterile, your skin needs to be as free of germs as possible.  You can reduce the number of germs on your skin by washing with CHG (chlorahexidine gluconate) soap before surgery.  CHG is an antiseptic cleaner which kills germs and bonds with the skin to continue killing germs even after washing. Please DO NOT use if you have an allergy to CHG or antibacterial soaps.  If your skin becomes reddened/irritated stop using the CHG and inform your nurse when you arrive at Short Stay. Do not shave (including legs and underarms) for at least 48 hours prior to the first CHG shower.  You may shave your face/neck. Please  follow these instructions carefully:  1.  Shower with CHG Soap the night before surgery and the  morning of Surgery.  2.  If you choose to wash your hair, wash your hair first as usual with your  normal  shampoo.  3.  After you shampoo, rinse your hair and body thoroughly to remove the  shampoo.                           4.  Use CHG as you would any other liquid soap.  You can apply chg directly  to the skin and wash                       Gently with a scrungie or clean washcloth.  5.  Apply the CHG Soap to your body ONLY FROM THE NECK DOWN.   Do not use on face/ open                           Wound or open sores. Avoid contact with eyes, ears mouth and genitals (private parts).                       Wash face,  Medical illustrator (private parts)  with your normal soap.             6.  Wash thoroughly, paying special attention to the area where your surgery  will be performed.  7.  Thoroughly rinse your body with warm water from the neck down.  8.  DO NOT shower/wash with your normal soap after using and rinsing off  the CHG Soap.                9.  Pat yourself dry with a clean towel.            10.  Wear clean pajamas.            11.  Place clean sheets on your bed the night of your first shower and do not  sleep with pets. Day of Surgery : Do not apply any lotions/deodorants the morning of surgery.  Please wear clean clothes to the hospital/surgery center.  FAILURE TO FOLLOW THESE INSTRUCTIONS MAY RESULT IN THE CANCELLATION OF YOUR SURGERY PATIENT SIGNATURE_________________________________  NURSE SIGNATURE__________________________________  ________________________________________________________________________

## 2013-12-09 ENCOUNTER — Ambulatory Visit (HOSPITAL_COMMUNITY): Payer: BC Managed Care – PPO | Admitting: Registered Nurse

## 2013-12-09 ENCOUNTER — Encounter (HOSPITAL_COMMUNITY)
Admission: RE | Disposition: A | Discharge status: Home / Self Care | Payer: Self-pay | Source: Ambulatory Visit | Attending: General Surgery

## 2013-12-09 ENCOUNTER — Encounter (HOSPITAL_COMMUNITY): Payer: BC Managed Care – PPO | Admitting: Registered Nurse

## 2013-12-09 ENCOUNTER — Encounter (HOSPITAL_COMMUNITY): Payer: Self-pay | Admitting: *Deleted

## 2013-12-09 ENCOUNTER — Ambulatory Visit (HOSPITAL_COMMUNITY)
Admission: RE | Admit: 2013-12-09 | Discharge: 2013-12-09 | Disposition: A | Discharge status: Home / Self Care | Payer: BC Managed Care – PPO | Source: Ambulatory Visit | Attending: General Surgery | Admitting: General Surgery

## 2013-12-09 DIAGNOSIS — K3580 Unspecified acute appendicitis: Secondary | ICD-10-CM | POA: Insufficient documentation

## 2013-12-09 DIAGNOSIS — K37 Unspecified appendicitis: Secondary | ICD-10-CM | POA: Diagnosis present

## 2013-12-09 DIAGNOSIS — J45909 Unspecified asthma, uncomplicated: Secondary | ICD-10-CM | POA: Diagnosis not present

## 2013-12-09 DIAGNOSIS — Z91010 Allergy to peanuts: Secondary | ICD-10-CM | POA: Insufficient documentation

## 2013-12-09 DIAGNOSIS — F17200 Nicotine dependence, unspecified, uncomplicated: Secondary | ICD-10-CM | POA: Insufficient documentation

## 2013-12-09 DIAGNOSIS — K352 Acute appendicitis with generalized peritonitis, without abscess: Secondary | ICD-10-CM | POA: Diagnosis present

## 2013-12-09 HISTORY — PX: LAPAROSCOPIC APPENDECTOMY: SHX408

## 2013-12-09 SURGERY — APPENDECTOMY, LAPAROSCOPIC
Anesthesia: General | Site: Abdomen

## 2013-12-09 MED ORDER — ONDANSETRON HCL 4 MG/2ML IJ SOLN
INTRAMUSCULAR | Status: DC | PRN
  Administered 2013-12-09: 4 mg via INTRAVENOUS

## 2013-12-09 MED ORDER — FENTANYL CITRATE 0.05 MG/ML IJ SOLN
INTRAMUSCULAR | Status: AC
  Filled 2013-12-09: qty 5

## 2013-12-09 MED ORDER — MOMETASONE FURO-FORMOTEROL FUM 100-5 MCG/ACT IN AERO
2.0000 | INHALATION_SPRAY | Freq: Two times a day (BID) | RESPIRATORY_TRACT | Status: DC
  Filled 2013-12-09: qty 8.8

## 2013-12-09 MED ORDER — 0.9 % SODIUM CHLORIDE (POUR BTL) OPTIME
TOPICAL | Status: DC | PRN
  Administered 2013-12-09: 1000 mL

## 2013-12-09 MED ORDER — ENOXAPARIN SODIUM 40 MG/0.4ML ~~LOC~~ SOLN
40.0000 mg | SUBCUTANEOUS | Status: DC
  Filled 2013-12-09: qty 0.4

## 2013-12-09 MED ORDER — FLUTICASONE PROPIONATE 50 MCG/ACT NA SUSP
1.0000 | Freq: Every evening | NASAL | Status: DC
  Filled 2013-12-09: qty 16

## 2013-12-09 MED ORDER — MEPERIDINE HCL 50 MG/ML IJ SOLN: 6.2500 mg | INTRAMUSCULAR | Status: DC | PRN

## 2013-12-09 MED ORDER — PROMETHAZINE HCL 25 MG/ML IJ SOLN: 6.2500 mg | INTRAMUSCULAR | Status: DC | PRN

## 2013-12-09 MED ORDER — LACTATED RINGERS IV SOLN: INTRAVENOUS | Status: DC | PRN

## 2013-12-09 MED ORDER — LIDOCAINE HCL (CARDIAC) 20 MG/ML IV SOLN
INTRAVENOUS | Status: DC | PRN
  Administered 2013-12-09: 50 mg via INTRAVENOUS

## 2013-12-09 MED ORDER — ONDANSETRON HCL 4 MG/2ML IJ SOLN: 4.0000 mg | INTRAMUSCULAR | Status: DC | PRN

## 2013-12-09 MED ORDER — BUPIVACAINE HCL (PF) 0.5 % IJ SOLN
INTRAMUSCULAR | Status: DC | PRN
  Administered 2013-12-09: 15 mL

## 2013-12-09 MED ORDER — GLYCOPYRROLATE 0.2 MG/ML IJ SOLN
INTRAMUSCULAR | Status: DC | PRN
  Administered 2013-12-09: 0.6 mg via INTRAVENOUS

## 2013-12-09 MED ORDER — ROCURONIUM BROMIDE 100 MG/10ML IV SOLN
INTRAVENOUS | Status: AC
  Filled 2013-12-09: qty 1

## 2013-12-09 MED ORDER — HYDROMORPHONE HCL PF 1 MG/ML IJ SOLN
INTRAMUSCULAR | Status: AC
  Filled 2013-12-09: qty 1

## 2013-12-09 MED ORDER — SUCCINYLCHOLINE CHLORIDE 20 MG/ML IJ SOLN
INTRAMUSCULAR | Status: DC | PRN
  Administered 2013-12-09: 100 mg via INTRAVENOUS

## 2013-12-09 MED ORDER — MIDAZOLAM HCL 2 MG/2ML IJ SOLN
INTRAMUSCULAR | Status: AC
  Filled 2013-12-09: qty 2

## 2013-12-09 MED ORDER — HYDROCODONE-ACETAMINOPHEN 5-325 MG PO TABS
1.0000 | ORAL_TABLET | ORAL | Status: DC | PRN
  Administered 2013-12-09 (×2): 1 via ORAL
  Filled 2013-12-09: qty 2
  Filled 2013-12-09: qty 1

## 2013-12-09 MED ORDER — HYDROCODONE-ACETAMINOPHEN 5-325 MG PO TABS: 1.0000 | ORAL_TABLET | ORAL | Status: DC | PRN

## 2013-12-09 MED ORDER — MORPHINE SULFATE 2 MG/ML IJ SOLN: 2.0000 mg | INTRAMUSCULAR | Status: DC | PRN

## 2013-12-09 MED ORDER — NEOSTIGMINE METHYLSULFATE 10 MG/10ML IV SOLN
INTRAVENOUS | Status: AC
  Filled 2013-12-09: qty 1

## 2013-12-09 MED ORDER — LIDOCAINE HCL (CARDIAC) 20 MG/ML IV SOLN
INTRAVENOUS | Status: AC
  Filled 2013-12-09: qty 5

## 2013-12-09 MED ORDER — CEFOTETAN DISODIUM-DEXTROSE 2-2.08 GM-% IV SOLR
2.0000 g | INTRAVENOUS | Status: AC
  Administered 2013-12-09: 2 g via INTRAVENOUS

## 2013-12-09 MED ORDER — MIDAZOLAM HCL 5 MG/5ML IJ SOLN
INTRAMUSCULAR | Status: DC | PRN
  Administered 2013-12-09: 2 mg via INTRAVENOUS

## 2013-12-09 MED ORDER — DEXAMETHASONE SODIUM PHOSPHATE 10 MG/ML IJ SOLN
INTRAMUSCULAR | Status: DC | PRN
  Administered 2013-12-09: 10 mg via INTRAVENOUS

## 2013-12-09 MED ORDER — HYDROMORPHONE HCL PF 1 MG/ML IJ SOLN
0.2500 mg | INTRAMUSCULAR | Status: DC | PRN
  Administered 2013-12-09 (×2): 0.5 mg via INTRAVENOUS

## 2013-12-09 MED ORDER — PROPOFOL 10 MG/ML IV BOLUS
INTRAVENOUS | Status: AC
  Filled 2013-12-09: qty 20

## 2013-12-09 MED ORDER — LACTATED RINGERS IV SOLN
INTRAVENOUS | Status: DC | PRN
Start: 2013-12-09 — End: 2013-12-09
  Administered 2013-12-09 (×2): via INTRAVENOUS

## 2013-12-09 MED ORDER — GLYCOPYRROLATE 0.2 MG/ML IJ SOLN
INTRAMUSCULAR | Status: AC
  Filled 2013-12-09: qty 3

## 2013-12-09 MED ORDER — KCL-LACTATED RINGERS-D5W 20 MEQ/L IV SOLN
1000.0000 mL | INTRAVENOUS | Status: DC
  Administered 2013-12-09: 1000 mL via INTRAVENOUS
  Filled 2013-12-09 (×2): qty 1000

## 2013-12-09 MED ORDER — ALBUTEROL SULFATE (2.5 MG/3ML) 0.083% IN NEBU: 3.0000 mL | INHALATION_SOLUTION | Freq: Four times a day (QID) | RESPIRATORY_TRACT | Status: DC | PRN

## 2013-12-09 MED ORDER — LACTATED RINGERS IR SOLN
Status: DC | PRN
  Administered 2013-12-09: 1000 mL

## 2013-12-09 MED ORDER — BUPIVACAINE HCL (PF) 0.5 % IJ SOLN
INTRAMUSCULAR | Status: AC
  Filled 2013-12-09: qty 30

## 2013-12-09 MED ORDER — ROCURONIUM BROMIDE 100 MG/10ML IV SOLN
INTRAVENOUS | Status: DC | PRN
  Administered 2013-12-09: 5 mg via INTRAVENOUS
  Administered 2013-12-09: 25 mg via INTRAVENOUS
  Administered 2013-12-09: 10 mg via INTRAVENOUS

## 2013-12-09 MED ORDER — OXYCODONE HCL 5 MG/5ML PO SOLN
5.0000 mg | Freq: Once | ORAL | Status: DC | PRN
  Filled 2013-12-09: qty 5

## 2013-12-09 MED ORDER — CEFOTETAN DISODIUM-DEXTROSE 2-2.08 GM-% IV SOLR
INTRAVENOUS | Status: AC
  Filled 2013-12-09: qty 50

## 2013-12-09 MED ORDER — NEOSTIGMINE METHYLSULFATE 10 MG/10ML IV SOLN
INTRAVENOUS | Status: DC | PRN
  Administered 2013-12-09: 4 mg via INTRAVENOUS

## 2013-12-09 MED ORDER — PROPOFOL 10 MG/ML IV BOLUS
INTRAVENOUS | Status: DC | PRN
  Administered 2013-12-09: 180 mg via INTRAVENOUS

## 2013-12-09 MED ORDER — OXYCODONE HCL 5 MG PO TABS: 5.0000 mg | ORAL_TABLET | Freq: Once | ORAL | Status: DC | PRN

## 2013-12-09 MED ORDER — FENTANYL CITRATE 0.05 MG/ML IJ SOLN
INTRAMUSCULAR | Status: DC | PRN
  Administered 2013-12-09 (×2): 100 ug via INTRAVENOUS
  Administered 2013-12-09: 50 ug via INTRAVENOUS

## 2013-12-09 MED ORDER — DEXTROSE 5 % IV SOLN
1.0000 g | Freq: Two times a day (BID) | INTRAVENOUS | Status: DC
  Filled 2013-12-09: qty 1

## 2013-12-09 MED ORDER — ONDANSETRON HCL 4 MG PO TABS: 4.0000 mg | ORAL_TABLET | Freq: Four times a day (QID) | ORAL | Status: DC | PRN

## 2013-12-09 SURGICAL SUPPLY — 51 items
APL SKNCLS STERI-STRIP NONHPOA (GAUZE/BANDAGES/DRESSINGS) ×1
APPLIER CLIP 5 13 M/L LIGAMAX5 (MISCELLANEOUS)
APPLIER CLIP ROT 10 11.4 M/L (STAPLE)
APR CLP MED LRG 11.4X10 (STAPLE)
APR CLP MED LRG 5 ANG JAW (MISCELLANEOUS)
BAG SPEC RTRVL LRG 6X4 10 (ENDOMECHANICALS) ×1
BENZOIN TINCTURE PRP APPL 2/3 (GAUZE/BANDAGES/DRESSINGS) ×2 IMPLANT
CANISTER SUCTION 2500CC (MISCELLANEOUS) ×1 IMPLANT
CHLORAPREP W/TINT 26ML (MISCELLANEOUS) ×2 IMPLANT
CLIP APPLIE 5 13 M/L LIGAMAX5 (MISCELLANEOUS) IMPLANT
CLIP APPLIE ROT 10 11.4 M/L (STAPLE) IMPLANT
CUTTER FLEX LINEAR 45M (STAPLE) ×1 IMPLANT
DECANTER SPIKE VIAL GLASS SM (MISCELLANEOUS) ×1 IMPLANT
DRAIN CHANNEL 19F RND (DRAIN) IMPLANT
DRAPE LAPAROSCOPIC ABDOMINAL (DRAPES) ×2 IMPLANT
DRSG TEGADERM 2-3/8X2-3/4 SM (GAUZE/BANDAGES/DRESSINGS) ×3 IMPLANT
DRSG TEGADERM 4X4.75 (GAUZE/BANDAGES/DRESSINGS) ×1 IMPLANT
ELECT REM PT RETURN 9FT ADLT (ELECTROSURGICAL) ×2
ELECTRODE REM PT RTRN 9FT ADLT (ELECTROSURGICAL) ×1 IMPLANT
ENDOLOOP SUT PDS II  0 18 (SUTURE)
ENDOLOOP SUT PDS II 0 18 (SUTURE) IMPLANT
EVACUATOR SILICONE 100CC (DRAIN) IMPLANT
GAUZE SPONGE 2X2 8PLY STRL LF (GAUZE/BANDAGES/DRESSINGS) IMPLANT
GLOVE BIOGEL PI IND STRL 7.0 (GLOVE) IMPLANT
GLOVE BIOGEL PI INDICATOR 7.0 (GLOVE) ×1
GLOVE ECLIPSE 8.0 STRL XLNG CF (GLOVE) ×2 IMPLANT
GLOVE INDICATOR 8.0 STRL GRN (GLOVE) ×2 IMPLANT
GOWN STRL REUS W/TWL XL LVL3 (GOWN DISPOSABLE) ×4 IMPLANT
KIT BASIN OR (CUSTOM PROCEDURE TRAY) ×2 IMPLANT
MANIFOLD NEPTUNE II (INSTRUMENTS) ×1 IMPLANT
POUCH SPECIMEN RETRIEVAL 10MM (ENDOMECHANICALS) ×2 IMPLANT
RELOAD 45 VASCULAR/THIN (ENDOMECHANICALS) IMPLANT
RELOAD STAPLE 45 2.5 WHT GRN (ENDOMECHANICALS) IMPLANT
RELOAD STAPLE 45 3.5 BLU ETS (ENDOMECHANICALS) IMPLANT
RELOAD STAPLE TA45 3.5 REG BLU (ENDOMECHANICALS) ×2 IMPLANT
SCISSORS LAP 5X35 DISP (ENDOMECHANICALS) ×1 IMPLANT
SET IRRIG TUBING LAPAROSCOPIC (IRRIGATION / IRRIGATOR) ×2 IMPLANT
SHEARS HARMONIC ACE PLUS 36CM (ENDOMECHANICALS) ×2 IMPLANT
SLEEVE XCEL OPT CAN 5 100 (ENDOMECHANICALS) ×2 IMPLANT
SOLUTION ANTI FOG 6CC (MISCELLANEOUS) ×2 IMPLANT
SPONGE GAUZE 2X2 STER 10/PKG (GAUZE/BANDAGES/DRESSINGS) ×1
STRIP CLOSURE SKIN 1/2X4 (GAUZE/BANDAGES/DRESSINGS) ×2 IMPLANT
SUT ETHILON 3 0 PS 1 (SUTURE) IMPLANT
SUT MNCRL AB 4-0 PS2 18 (SUTURE) ×2 IMPLANT
TOWEL OR 17X26 10 PK STRL BLUE (TOWEL DISPOSABLE) ×2 IMPLANT
TOWEL OR NON WOVEN STRL DISP B (DISPOSABLE) ×2 IMPLANT
TRAY FOLEY CATH 14FRSI W/METER (CATHETERS) ×1 IMPLANT
TRAY LAP CHOLE (CUSTOM PROCEDURE TRAY) ×2 IMPLANT
TROCAR BLADELESS OPT 5 100 (ENDOMECHANICALS) ×2 IMPLANT
TROCAR XCEL BLUNT TIP 100MML (ENDOMECHANICALS) ×2 IMPLANT
TUBING INSUFFLATION 10FT LAP (TUBING) ×2 IMPLANT

## 2013-12-09 NOTE — Interval H&P Note (Signed)
History and Physical Interval Note:  12/09/2013 7:22 AM  David Olsen  has presented today for surgery, with the diagnosis of Perforated Appendicitis  The various methods of treatment have been discussed with the patient and family. After consideration of risks, benefits and other options for treatment, the patient has consented to  Procedure(s): LAPAROSCOPIC, POSSIBLE OPEN APPENDECTOMY (N/A) as a surgical intervention .  The patient's history has been reviewed, patient examined, no change in status, stable for surgery.  I have reviewed the patient's chart and labs.  Questions were answered to the patient's satisfaction.     Judith Campillo Shela Commons

## 2013-12-09 NOTE — Anesthesia Preprocedure Evaluation (Addendum)
Anesthesia Evaluation  Patient identified by MRN, date of birth, ID band Patient awake    Reviewed: Allergy & Precautions, H&P , NPO status , Patient's Chart, lab work & pertinent test results  Airway Mallampati: II TM Distance: >3 FB Neck ROM: Full    Dental no notable dental hx.    Pulmonary asthma , Current Smoker,  breath sounds clear to auscultation  Pulmonary exam normal       Cardiovascular negative cardio ROS  Rhythm:Regular Rate:Normal     Neuro/Psych negative neurological ROS  negative psych ROS   GI/Hepatic negative GI ROS, Neg liver ROS,   Endo/Other  negative endocrine ROS  Renal/GU negative Renal ROS     Musculoskeletal negative musculoskeletal ROS (+)   Abdominal   Peds  Hematology negative hematology ROS (+)   Anesthesia Other Findings   Reproductive/Obstetrics                          Anesthesia Physical Anesthesia Plan  ASA: II  Anesthesia Plan: General   Post-op Pain Management:    Induction: Intravenous  Airway Management Planned: Oral ETT  Additional Equipment:   Intra-op Plan:   Post-operative Plan: Extubation in OR  Informed Consent: I have reviewed the patients History and Physical, chart, labs and discussed the procedure including the risks, benefits and alternatives for the proposed anesthesia with the patient or authorized representative who has indicated his/her understanding and acceptance.   Dental advisory given  Plan Discussed with: CRNA  Anesthesia Plan Comments:        Anesthesia Quick Evaluation

## 2013-12-09 NOTE — Anesthesia Postprocedure Evaluation (Signed)
Anesthesia Post Note  Patient: David Olsen  Procedure(s) Performed: Procedure(s) (LRB): LAPAROSCOPIC INTERVAL APPENDECTOMY (N/A)  Anesthesia type: General  Patient location: PACU  Post pain: Pain level controlled  Post assessment: Post-op Vital signs reviewed  Last Vitals: BP 128/79  Pulse 51  Temp(Src) 36.6 C (Oral)  Resp 16  SpO2 100%  Post vital signs: Reviewed  Level of consciousness: sedated  Complications: No apparent anesthesia complications

## 2013-12-09 NOTE — Progress Notes (Signed)
Nurse reviewed discharge instructions with pt.  Pt verbalized understanding of discharge instructions, follow up appointments and new medications.  No concerns at time of discharge. 

## 2013-12-09 NOTE — H&P (Signed)
David Olsen is an 53 y.o. male.   Chief Complaint: Here for elective surgery HPI:   He was admitted earlier in the summer with perforated appendicitis and significant inflammatory changes. He was successfully treated nonoperatively. He now presents for interval appendectomy.  Past Medical History  Diagnosis Date  . Asthma     Past Surgical History  Procedure Laterality Date  . Colonscopy  age 53 or 86    Family History  Problem Relation Age of Onset  . Cancer Mother   . Coronary artery disease Father    Social History:  reports that he has been smoking Cigars.  He has never used smokeless tobacco. He reports that he drinks alcohol. He reports that he does not use illicit drugs.  Allergies:  Allergies  Allergen Reactions  . Peanuts [Peanut Oil] Rash    Medications Prior to Admission  Medication Sig Dispense Refill  . albuterol (PROVENTIL HFA;VENTOLIN HFA) 108 (90 BASE) MCG/ACT inhaler Inhale 1 puff into the lungs every 6 (six) hours as needed for wheezing or shortness of breath.      . fexofenadine (ALLEGRA) 180 MG tablet Take 180 mg by mouth at bedtime.      . fluticasone (FLONASE) 50 MCG/ACT nasal spray Place 1 spray into both nostrils every evening.       . Fluticasone-Salmeterol (ADVAIR) 250-50 MCG/DOSE AEPB Inhale 1 puff into the lungs 2 (two) times daily.      . Probiotic Product (PROBIOTIC DAILY PO) Take 1 tablet by mouth daily.      Marland Kitchen acetaminophen (TYLENOL) 500 MG tablet Take 1,000 mg by mouth every 6 (six) hours as needed for mild pain or moderate pain.      Marland Kitchen glucosamine-chondroitin 500-400 MG tablet Take 1 tablet by mouth daily.      Marland Kitchen ibuprofen (ADVIL,MOTRIN) 200 MG tablet Take 400 mg by mouth every 6 (six) hours as needed for mild pain or moderate pain.      . Multiple Vitamins-Minerals (MULTIVITAMIN WITH MINERALS) tablet Take 1 tablet by mouth daily.      Marland Kitchen omega-3 acid ethyl esters (LOVAZA) 1 G capsule Take 1 g by mouth daily.        No results found for  this or any previous visit (from the past 48 hour(s)). No results found.  Review of Systems  Constitutional: Negative for fever and chills.  Gastrointestinal: Negative for nausea, vomiting and abdominal pain.    Blood pressure 129/84, pulse 54, temperature 98 F (36.7 C), temperature source Oral, resp. rate 18, SpO2 98.00%. Physical Exam  Constitutional: He appears well-developed and well-nourished. No distress.  HENT:  Head: Normocephalic and atraumatic.  Cardiovascular: Normal rate and regular rhythm.   Respiratory: Effort normal and breath sounds normal.  GI: Soft. He exhibits no distension and no mass. There is no tenderness.  Musculoskeletal: He exhibits no edema.  Neurological: He is alert.  Skin: Skin is warm and dry.  Psychiatric: He has a normal mood and affect.     Assessment/Plan Perforated appendicitis earlier in the summer treated successfully nonoperatively.  Plan: Interval appendectomy, laparoscopic possible open.  Sparsh Callens J 12/09/2013, 7:17 AM

## 2013-12-09 NOTE — Op Note (Signed)
Operative Note  David Olsen male 53 y.o. 12/09/2013  PREOPERATIVE DX:  Perforated appendicitis treated nonoperatively in the past.  POSTOPERATIVE DX:  Same  PROCEDURE:  Laparoscopic interval appendectomy         Surgeon: Adolph Pollack    Anesthesia: General endotracheal anesthesia  Indications:   This is a 53 year old male with contained perforation the appendix in the past treated nonoperatively. He now presents for interval appendectomy. We discussed the procedure and risks preoperatively.      Procedure Detail:  He was seen in the holding area. He was brought to the operating room placed supine on the table general anesthetic was induced. A Foley catheter was placed. An orogastric is placed the de-airing abdominal wall was clipped. Abdominal wall was widely sterilely prepped and draped.  Marcaine was infiltrated in the subumbilical region. A small subumbilical incision was made through the skin, subcutaneous tissue, anterior posterior patches, and peritoneum. The peritoneal cavity was entered under direct vision. A pursestring suture of 0 Vicryl placed around the edges of the fascia. A Hassan trocar is introduced into the peritoneal cavity and a pneumoperitoneum was created by insufflation of CO2 gas  The area under the trocar was inspected and there is no evidence of bleeding or organ injury. A 5 mm trocar was placed in the left lower quadrant area. A 5 mm trochars placed in the right upper quadrant. Patient was placed in Trendelenburg position and the right side was tilted up. I visualized the right lower quadrant and identified the cecum and terminal ileum. There were some inflammatory changes between the cecum and abdominal wall which were divided with the Harmonic scalpel. I was able to identify the appendix with its tip in the retrocecal position and it was involved in a dense inflammatory process with the cecum. I began by mobilizing the mesentery beginning at the  midportion the appendix and extending toward the base. Using sharp dissection I then was able to dissected the tip of the appendix free from the cecum. No colotomy was made. I then mobilized the mesentery of the appendix more toward the base of the cecum. Using an Endo GIA stapler, I had amputated the appendix with a cuff of cecum. The appendix was placed in a retrieval bag and removed and sent to pathology.  An echo was irrigated the right lower quadrant and carefully inspected the cecum and terminal ileum. There is no evidence of bleeding from the staple line area no evidence of leak from the staple line. No evidence of enterotomy or colotomy. 4 quadrant inspection demonstrated no evidence of organ injury or bleeding.  The subumbilical trocar was removed. The fascia defect was closed under laparoscopic vision by tying down the purse string suture. The remaining trocars were removed the pneumoperitoneum was released.  The skin incisions were closed with 4-0 Monocryl subcuticular stitches. Steri-Strips and sterile dressings were applied. He tolerated the procedure well without any apparent complications and was taken to the recovery room in satisfactory condition.   Estimated Blood Loss:  less than 100 mL         Drains: none  Blood Given: none          Specimens: appendix        Complications:  * No complications entered in OR log *         Disposition: PACU - hemodynamically stable.         Condition: stable

## 2013-12-09 NOTE — Addendum Note (Signed)
Addendum created 12/09/13 1410 by Gaylan Gerold, MD   Modules edited: BPA Follow-up Actions, Clinical Notes   Clinical Notes:  File: [id number]

## 2013-12-09 NOTE — Discharge Instructions (Addendum)
[facility] ______[facility] LAPAROSCOPIC APPENDIX SURGERY: POST OP INSTRUCTIONS Always review your discharge instruction sheet given to you by the facility where your surgery was performed. IF YOU HAVE DISABILITY OR FAMILY LEAVE FORMS, YOU MUST BRING THEM TO THE OFFICE FOR PROCESSING.   DO NOT GIVE THEM TO YOUR DOCTOR.  1. A prescription for pain medication may be given to you upon discharge.  Take your pain medication as prescribed, if needed.  If narcotic pain medicine is not needed, then you may take acetaminophen (Tylenol) or ibuprofen (Advil) as needed. 2. Take your usually prescribed medications unless otherwise directed. 3. If you need a refill on your pain medication, please contact your pharmacy.  They will contact our office to request authorization. Prescriptions will not be filled after 5pm or on week-ends. 4. You should follow a light diet the first few days after arrival home, such as soup and crackers, etc.  Be sure to include lots of fluids daily. 5. Most patients will experience some swelling and bruising in the area of the incisions.  Ice packs will help.  Swelling and bruising can take several days to resolve.  6. It is common to experience some constipation if taking pain medication after surgery.  Increasing fluid intake and taking a stool softener (such as Colace) will usually help or prevent this problem from occurring.  A mild laxative (Milk of Magnesia or Miralax) should be taken according to package instructions if there are no bowel movements after 48 hours. 7. Unless discharge instructions indicate otherwise, you may remove your bandages 72 hours after surgery, and you may shower at that time.  You may have steri-strips (small skin tapes) in place directly over the incision.  These strips should be left on the skin.  If your surgeon used skin glue on the incision, you may shower in 24 hours.  The glue will flake off over the next 2-3 weeks.  Any sutures or staples  will be removed at the office during your follow-up visit. 8. ACTIVITIES:  You may resume regular (light) daily activities beginning the next day--such as daily self-care, walking, climbing stairs--gradually increasing activities as tolerated.  You may have sexual intercourse when it is comfortable.  Refrain from any heavy lifting or straining-nothing over 10 pound for 2 weeks.  a. You may drive when you are no longer taking prescription pain medication, you can comfortably wear a seatbelt, and you can safely maneuver your car and apply brakes. b. RETURN TO WORK:  Desk work in 5-7 days if comfortable.  Full activities in 2 weeks as long as they do not cause pain or discomfort.__________________________________________________________ 9. You should see your doctor in the office for a follow-up appointment approximately 2-3 weeks after your surgery.  Make sure that you call for this appointment within a day or two after you arrive home to insure a convenient appointment time. 10. OTHER INSTRUCTIONS: __________________________________________________________________________________________________________________________ __________________________________________________________________________________________________________________________ WHEN TO CALL YOUR DOCTOR: 1. Fever over 101.5 2. Inability to urinate 3. Continued bleeding from incision. 4. Increased pain, redness, or drainage from the incision. 5. Increasing abdominal pain  The clinic staff is available to answer your questions during regular business hours.  Please dont hesitate to call and ask to speak to one of the nurses for clinical concerns.  If you have a medical emergency, go to the nearest emergency room or call 911.  A surgeon from [facility] is always on call at the hospital. [address], [address], [place],  David Olsen  [id number] ? [address] [address], [place], [place]   [id number] [phone] ? [phone] ? [phone]  [phone] Web site: [email or web address]

## 2013-12-09 NOTE — Progress Notes (Signed)
He feels good and doing well.  He would like to go home.  Discharge instructions discussed with him.  Will discharge this evening.

## 2013-12-09 NOTE — Transfer of Care (Signed)
Immediate Anesthesia Transfer of Care Note  Patient: David Olsen  Procedure(s) Performed: Procedure(s): LAPAROSCOPIC INTERVAL APPENDECTOMY (N/A)  Patient Location: PACU  Anesthesia Type:General  Level of Consciousness: awake, alert  and oriented  Airway & Oxygen Therapy: Patient Spontanous Breathing and Patient connected to face mask oxygen  Post-op Assessment: Report given to PACU RN and Post -op Vital signs reviewed and stable  Post vital signs: Reviewed and stable  Complications: No apparent anesthesia complications

## 2013-12-12 ENCOUNTER — Encounter (HOSPITAL_COMMUNITY): Payer: Self-pay | Admitting: General Surgery

## 2014-01-13 ENCOUNTER — Other Ambulatory Visit: Payer: Self-pay

## 2016-08-14 ENCOUNTER — Ambulatory Visit
Admission: RE | Admit: 2016-08-14 | Discharge: 2016-08-14 | Disposition: A | Discharge status: Home / Self Care | Payer: Self-pay | Source: Ambulatory Visit | Attending: Allergy | Admitting: Allergy

## 2016-08-14 ENCOUNTER — Ambulatory Visit
Admission: RE | Admit: 2016-08-14 | Discharge: 2016-08-14 | Disposition: A | Discharge status: Home / Self Care | Payer: 59 | Source: Ambulatory Visit | Attending: Allergy | Admitting: Allergy

## 2016-08-14 ENCOUNTER — Other Ambulatory Visit: Payer: Self-pay | Admitting: Allergy

## 2016-08-14 DIAGNOSIS — J329 Chronic sinusitis, unspecified: Secondary | ICD-10-CM

## 2016-08-14 DIAGNOSIS — J209 Acute bronchitis, unspecified: Secondary | ICD-10-CM

## 2019-03-19 ENCOUNTER — Other Ambulatory Visit: Payer: Self-pay

## 2019-03-19 ENCOUNTER — Emergency Department (HOSPITAL_BASED_OUTPATIENT_CLINIC_OR_DEPARTMENT_OTHER): Payer: 59

## 2019-03-19 ENCOUNTER — Emergency Department (HOSPITAL_BASED_OUTPATIENT_CLINIC_OR_DEPARTMENT_OTHER)
Admission: EM | Admit: 2019-03-19 | Discharge: 2019-03-19 | Disposition: A | Discharge status: Home / Self Care | Payer: 59 | Attending: Emergency Medicine | Admitting: Emergency Medicine

## 2019-03-19 ENCOUNTER — Encounter (HOSPITAL_BASED_OUTPATIENT_CLINIC_OR_DEPARTMENT_OTHER): Payer: Self-pay

## 2019-03-19 DIAGNOSIS — Z91010 Allergy to peanuts: Secondary | ICD-10-CM | POA: Insufficient documentation

## 2019-03-19 DIAGNOSIS — Y9389 Activity, other specified: Secondary | ICD-10-CM | POA: Insufficient documentation

## 2019-03-19 DIAGNOSIS — M7041 Prepatellar bursitis, right knee: Secondary | ICD-10-CM | POA: Insufficient documentation

## 2019-03-19 DIAGNOSIS — J45909 Unspecified asthma, uncomplicated: Secondary | ICD-10-CM | POA: Insufficient documentation

## 2019-03-19 DIAGNOSIS — F17290 Nicotine dependence, other tobacco product, uncomplicated: Secondary | ICD-10-CM | POA: Diagnosis not present

## 2019-03-19 DIAGNOSIS — M25561 Pain in right knee: Secondary | ICD-10-CM | POA: Diagnosis present

## 2019-03-19 DIAGNOSIS — Z79899 Other long term (current) drug therapy: Secondary | ICD-10-CM | POA: Insufficient documentation

## 2019-03-19 MED ORDER — CEPHALEXIN 500 MG PO CAPS: 500.0000 mg | ORAL_CAPSULE | Freq: Four times a day (QID) | ORAL | 0 refills | Status: AC

## 2019-03-19 MED ORDER — SULFAMETHOXAZOLE-TRIMETHOPRIM 800-160 MG PO TABS: 1.0000 | ORAL_TABLET | Freq: Two times a day (BID) | ORAL | 0 refills | Status: AC

## 2019-03-19 NOTE — ED Provider Notes (Signed)
[facility] EMERGENCY DEPARTMENT Provider Note   CSN: [id number] Arrival date & time: 03/19/19  0932     History Chief Complaint  Patient presents with  . Knee Pain    David Olsen is a 58 y.o. male presenting with swollen right knee since yesterday. He notes he was redoing his hardwood floors on Monday/Tusday (12/14-12/15), he was wearing knee pads, but was on his knees a lot those two days. He also notes he went to the gym on Thursday and was doing various leg exercises. He woke up this morning and his knee was significantly more swollen and red. He notes when he is at rest, the pain is minimal (pain 1). Pain worsens (Pain 6-7) with weight bearing and exertion. Denies any fevers, chills, nausea, vomiting. Denies any other joints involvements. Denies any recent illnesses. PMH notable for asthma/allergies.Denies history of gout.       Past Medical History:  Diagnosis Date  . Asthma     Patient Active Problem List   Diagnosis Date Noted  . Appendicitis s/p laparoscopic interval appendectomy 12/09/13 12/09/2013  . Perforated appendicitis 09/01/2013    Past Surgical History:  Procedure Laterality Date  . colonscopy  age 58 or 891  . LAPAROSCOPIC APPENDECTOMY N/A 12/09/2013   Procedure: LAPAROSCOPIC INTERVAL APPENDECTOMY;  Surgeon: Avel Peaceodd Rosenbower, MD;  Location: [facility];  Service: General;  Laterality: N/A;       Family History  Problem Relation Age of Onset  . Cancer Mother   . Coronary artery disease Father     Social History   Tobacco Use  . Smoking status: Light Tobacco Smoker    Years: 5.00    Types: Cigars  . Smokeless tobacco: Never Used  Substance Use Topics  . Alcohol use: Yes    Comment: occ  . Drug use: No    Home Medications Prior to Admission medications   Medication Sig Start Date End Date Taking? Authorizing Provider  albuterol (PROVENTIL HFA;VENTOLIN HFA) 108 (90 BASE) MCG/ACT inhaler Inhale 1 puff into the lungs every 6 (six) hours  as needed for wheezing or shortness of breath.    [provider]  cephALEXin (KEFLEX) 500 MG capsule Take 1 capsule (500 mg total) by mouth 4 (four) times daily for 5 days. 03/19/19 03/24/19  Emma-Lee Oddo P, DO  fexofenadine (ALLEGRA) 180 MG tablet Take 180 mg by mouth at bedtime.    [provider]  fluticasone (FLONASE) 50 MCG/ACT nasal spray Place 1 spray into both nostrils every evening.     [provider]  Fluticasone-Salmeterol (ADVAIR) 250-50 MCG/DOSE AEPB Inhale 1 puff into the lungs 2 (two) times daily.    [provider]  glucosamine-chondroitin 500-400 MG tablet Take 1 tablet by mouth daily.    [provider]  HYDROcodone-acetaminophen (NORCO/VICODIN) 5-325 MG per tablet Take 1-2 tablets by mouth every 4 (four) hours as needed for moderate pain or severe pain. 12/09/13   Avel Peaceosenbower, Todd, MD  ibuprofen (ADVIL,MOTRIN) 200 MG tablet Take 400 mg by mouth every 6 (six) hours as needed for mild pain or moderate pain.    [provider]  Multiple Vitamins-Minerals (MULTIVITAMIN WITH MINERALS) tablet Take 1 tablet by mouth daily.    [provider]  omega-3 acid ethyl esters (LOVAZA) 1 G capsule Take 1 g by mouth daily.    [provider]  Probiotic Product (PROBIOTIC DAILY PO) Take 1 tablet by mouth daily.    [provider]  sulfamethoxazole-trimethoprim (BACTRIM DS) 800-160  MG tablet Take 1 tablet by mouth 2 (two) times daily for 5 days. 03/19/19 03/24/19  Gualberto Wahlen P, DO    Allergies    Peanuts [peanut oil]  Review of Systems   Review of Systems  Constitutional: Negative for chills and fever.  HENT: Negative for sore throat.   Respiratory: Negative for cough and shortness of breath.   Cardiovascular: Negative for chest pain.  Gastrointestinal: Negative for abdominal pain, constipation, diarrhea, nausea and vomiting.  Musculoskeletal: Positive for joint swelling.    Physical Exam Updated  Vital Signs BP 138/74 (BP Location: Right Arm)   Pulse 68   Temp 98 F (36.7 C) (Oral)   Resp 16   Ht 5\' 9"  (1.753 m)   Wt 70.3 kg   SpO2 99%   BMI 22.89 kg/m   Physical Exam Constitutional:      Appearance: Normal appearance. He is normal weight.  Musculoskeletal:     Right hip: Normal.     Left hip: Normal.     Right knee: Swelling, erythema and bony tenderness present. Decreased range of motion. No LCL laxity, MCL laxity, ACL laxity or PCL laxity. Abnormal pulse.     Left knee: Normal.     Right ankle: Normal.     Left ankle: Normal.       Legs:  Skin:    Capillary Refill: Capillary refill takes less than 2 seconds.  Neurological:     Mental Status: He is alert.         ED Results / Procedures / Treatments   Labs (all labs ordered are listed, but only abnormal results are displayed) Labs Reviewed - No data to display  EKG None  Radiology DG Knee Complete 4 Views Right  Result Date: 03/19/2019 CLINICAL DATA:  Right knee pain, swelling, and redness since yesterday. EXAM: RIGHT KNEE - COMPLETE 4+ VIEW COMPARISON:  None. FINDINGS: No acute fracture or dislocation. No joint effusion. Mild medial compartment joint space narrowing. Multiple small well corticated ossific densities along the anterior aspect of the lateral tibial plateau, possibly intra-articular bodies. Bone mineralization is normal. Prominent prepatellar soft tissue swelling. IMPRESSION: 1. Prominent prepatellar soft tissue swelling. 2.  No acute osseous abnormality.  No joint effusion. 3. Mild medial compartment osteoarthritis. Electronically Signed   By: 03/21/2019 M.D.   On: 03/19/2019 11:09    Procedures Procedures (including critical care time)  Medications Ordered in ED Medications - No data to display  ED Course  I have reviewed the triage vital signs and the nursing notes.  Pertinent labs & imaging results that were available during my care of the patient were reviewed by me and  considered in my medical decision making (see chart for details).  58 y.o. male with no significant PMH presenting with acute onset anterior right knee pain, swelling, erythema, and warmth since yesterday after being on his knees for prolonged period of time replacing his hardwood floors. No history of remote trauma or falls. No systemic symptoms or other joint involvement. No history of gout. Patient is overall well appearing. On exam, pain, warmth, and erythema is localized to anterior knee without any joint line pain. No effusion appreciated. Patient does have good ROM although painful with active ROM. No signs of ACL/LCL/MCL/PCL trauma. Neurovascularly intact. Ankle and hips are WNL bilaterally.   Right knee x-ray obtained and results are above. No signs of fracture.   Most likekly differential includes prepatellar bursitis. Knee x-ray reassuring for intraarticular process. Discussed case with  on-call orthopedist who recommended compression and antibiotics with close outpatient follow up. Patient stable for discharge with above recommendations. He was discharged with 5 day course of Bactrim and Keflex. He was instructed to apply ace-bandage for compression, ice TID, Tylenol/Ibuprofen for pain, and activity as tolerated. Strict return precautions discussed. Contact information for outpatient ortho follow up provided.    MDM Rules/Calculators/A&P  Final Clinical Impression(s) / ED Diagnoses Final diagnoses:  Prepatellar bursitis of right knee    Rx / DC Orders ED Discharge Orders         Ordered    sulfamethoxazole-trimethoprim (BACTRIM DS) 800-160 MG tablet  2 times daily     03/19/19 1218    cephALEXin (KEFLEX) 500 MG capsule  4 times daily     03/19/19 1218           Mina Marble [place], DO 03/19/19 1417    Quintella Reichert, MD 03/20/19 [phone]

## 2019-03-19 NOTE — Discharge Instructions (Addendum)
You knee pain is likely from inflammation of the bursa of your knee. I recommend compression of the knee with an ace bandage. Ice the area three times a day for 15 minutes. Please be sure to call Dr. Griffin Basil on Monday to schedule an appointment. We have also prescribed you some antibiotics. It will be important for you to continue these until completion. Please return if the pain worsens, knee swelling worsens, you develop fevers/chills.   Take care and happy holidays.

## 2019-03-19 NOTE — ED Triage Notes (Signed)
Pt states beginning yesterday morning, noticed pain, swelling, redness warmth right knee.  Had virtual visit with pmd who recommended coming in to be seen.

## 2019-03-19 NOTE — ED Notes (Signed)
Patient transported to X-ray 

## 2020-02-02 DIAGNOSIS — J3089 Other allergic rhinitis: Secondary | ICD-10-CM | POA: Diagnosis not present

## 2020-02-02 DIAGNOSIS — J301 Allergic rhinitis due to pollen: Secondary | ICD-10-CM | POA: Diagnosis not present

## 2020-02-09 DIAGNOSIS — J301 Allergic rhinitis due to pollen: Secondary | ICD-10-CM | POA: Diagnosis not present

## 2020-02-09 DIAGNOSIS — J3089 Other allergic rhinitis: Secondary | ICD-10-CM | POA: Diagnosis not present

## 2020-02-16 DIAGNOSIS — J301 Allergic rhinitis due to pollen: Secondary | ICD-10-CM | POA: Diagnosis not present

## 2020-02-16 DIAGNOSIS — J3089 Other allergic rhinitis: Secondary | ICD-10-CM | POA: Diagnosis not present

## 2020-02-21 DIAGNOSIS — J301 Allergic rhinitis due to pollen: Secondary | ICD-10-CM | POA: Diagnosis not present

## 2020-02-21 DIAGNOSIS — J3089 Other allergic rhinitis: Secondary | ICD-10-CM | POA: Diagnosis not present

## 2020-03-01 DIAGNOSIS — J3089 Other allergic rhinitis: Secondary | ICD-10-CM | POA: Diagnosis not present

## 2020-03-01 DIAGNOSIS — J301 Allergic rhinitis due to pollen: Secondary | ICD-10-CM | POA: Diagnosis not present

## 2020-03-06 DIAGNOSIS — J301 Allergic rhinitis due to pollen: Secondary | ICD-10-CM | POA: Diagnosis not present

## 2020-03-06 DIAGNOSIS — J3089 Other allergic rhinitis: Secondary | ICD-10-CM | POA: Diagnosis not present

## 2020-03-07 DIAGNOSIS — M79675 Pain in left toe(s): Secondary | ICD-10-CM | POA: Diagnosis not present

## 2020-03-07 DIAGNOSIS — B351 Tinea unguium: Secondary | ICD-10-CM | POA: Diagnosis not present

## 2020-03-07 DIAGNOSIS — L600 Ingrowing nail: Secondary | ICD-10-CM | POA: Diagnosis not present

## 2020-03-07 DIAGNOSIS — M79674 Pain in right toe(s): Secondary | ICD-10-CM | POA: Diagnosis not present

## 2020-03-15 DIAGNOSIS — J3089 Other allergic rhinitis: Secondary | ICD-10-CM | POA: Diagnosis not present

## 2020-03-15 DIAGNOSIS — J301 Allergic rhinitis due to pollen: Secondary | ICD-10-CM | POA: Diagnosis not present

## 2020-03-19 DIAGNOSIS — Z20822 Contact with and (suspected) exposure to covid-19: Secondary | ICD-10-CM | POA: Diagnosis not present

## 2020-03-21 DIAGNOSIS — J301 Allergic rhinitis due to pollen: Secondary | ICD-10-CM | POA: Diagnosis not present

## 2020-03-21 DIAGNOSIS — J3089 Other allergic rhinitis: Secondary | ICD-10-CM | POA: Diagnosis not present

## 2020-03-28 DIAGNOSIS — J3089 Other allergic rhinitis: Secondary | ICD-10-CM | POA: Diagnosis not present

## 2020-03-28 DIAGNOSIS — J301 Allergic rhinitis due to pollen: Secondary | ICD-10-CM | POA: Diagnosis not present

## 2020-04-04 DIAGNOSIS — M79675 Pain in left toe(s): Secondary | ICD-10-CM | POA: Diagnosis not present

## 2020-04-04 DIAGNOSIS — L600 Ingrowing nail: Secondary | ICD-10-CM | POA: Diagnosis not present

## 2020-04-04 DIAGNOSIS — B351 Tinea unguium: Secondary | ICD-10-CM | POA: Diagnosis not present

## 2020-04-04 DIAGNOSIS — M79674 Pain in right toe(s): Secondary | ICD-10-CM | POA: Diagnosis not present

## 2020-04-05 DIAGNOSIS — J301 Allergic rhinitis due to pollen: Secondary | ICD-10-CM | POA: Diagnosis not present

## 2020-04-05 DIAGNOSIS — J3089 Other allergic rhinitis: Secondary | ICD-10-CM | POA: Diagnosis not present

## 2020-04-12 DIAGNOSIS — J3081 Allergic rhinitis due to animal (cat) (dog) hair and dander: Secondary | ICD-10-CM | POA: Diagnosis not present

## 2020-04-12 DIAGNOSIS — J301 Allergic rhinitis due to pollen: Secondary | ICD-10-CM | POA: Diagnosis not present

## 2020-04-12 DIAGNOSIS — J3089 Other allergic rhinitis: Secondary | ICD-10-CM | POA: Diagnosis not present

## 2020-04-19 DIAGNOSIS — J301 Allergic rhinitis due to pollen: Secondary | ICD-10-CM | POA: Diagnosis not present

## 2020-04-19 DIAGNOSIS — J3081 Allergic rhinitis due to animal (cat) (dog) hair and dander: Secondary | ICD-10-CM | POA: Diagnosis not present

## 2020-04-25 DIAGNOSIS — J301 Allergic rhinitis due to pollen: Secondary | ICD-10-CM | POA: Diagnosis not present

## 2020-04-25 DIAGNOSIS — J3089 Other allergic rhinitis: Secondary | ICD-10-CM | POA: Diagnosis not present

## 2020-04-25 DIAGNOSIS — J3081 Allergic rhinitis due to animal (cat) (dog) hair and dander: Secondary | ICD-10-CM | POA: Diagnosis not present

## 2020-04-26 DIAGNOSIS — J3089 Other allergic rhinitis: Secondary | ICD-10-CM | POA: Diagnosis not present

## 2020-05-02 DIAGNOSIS — M79675 Pain in left toe(s): Secondary | ICD-10-CM | POA: Diagnosis not present

## 2020-05-02 DIAGNOSIS — L600 Ingrowing nail: Secondary | ICD-10-CM | POA: Diagnosis not present

## 2020-05-02 DIAGNOSIS — J3089 Other allergic rhinitis: Secondary | ICD-10-CM | POA: Diagnosis not present

## 2020-05-02 DIAGNOSIS — J301 Allergic rhinitis due to pollen: Secondary | ICD-10-CM | POA: Diagnosis not present

## 2020-05-02 DIAGNOSIS — M79674 Pain in right toe(s): Secondary | ICD-10-CM | POA: Diagnosis not present

## 2020-05-02 DIAGNOSIS — B351 Tinea unguium: Secondary | ICD-10-CM | POA: Diagnosis not present

## 2020-05-08 DIAGNOSIS — J301 Allergic rhinitis due to pollen: Secondary | ICD-10-CM | POA: Diagnosis not present

## 2020-05-08 DIAGNOSIS — J3089 Other allergic rhinitis: Secondary | ICD-10-CM | POA: Diagnosis not present

## 2020-05-29 DIAGNOSIS — Z5181 Encounter for therapeutic drug level monitoring: Secondary | ICD-10-CM | POA: Diagnosis not present

## 2020-05-29 DIAGNOSIS — Z0000 Encounter for general adult medical examination without abnormal findings: Secondary | ICD-10-CM | POA: Diagnosis not present

## 2020-05-29 DIAGNOSIS — Z125 Encounter for screening for malignant neoplasm of prostate: Secondary | ICD-10-CM | POA: Diagnosis not present

## 2020-05-29 DIAGNOSIS — Z130 Encounter for screening for diseases of the blood and blood-forming organs and certain disorders involving the immune mechanism: Secondary | ICD-10-CM | POA: Diagnosis not present

## 2020-05-29 DIAGNOSIS — Z13220 Encounter for screening for lipoid disorders: Secondary | ICD-10-CM | POA: Diagnosis not present

## 2020-05-30 DIAGNOSIS — M79675 Pain in left toe(s): Secondary | ICD-10-CM | POA: Diagnosis not present

## 2020-05-30 DIAGNOSIS — M79674 Pain in right toe(s): Secondary | ICD-10-CM | POA: Diagnosis not present

## 2020-05-30 DIAGNOSIS — B351 Tinea unguium: Secondary | ICD-10-CM | POA: Diagnosis not present

## 2020-05-30 DIAGNOSIS — L600 Ingrowing nail: Secondary | ICD-10-CM | POA: Diagnosis not present

## 2020-05-31 DIAGNOSIS — J3081 Allergic rhinitis due to animal (cat) (dog) hair and dander: Secondary | ICD-10-CM | POA: Diagnosis not present

## 2020-05-31 DIAGNOSIS — J3089 Other allergic rhinitis: Secondary | ICD-10-CM | POA: Diagnosis not present

## 2020-05-31 DIAGNOSIS — J301 Allergic rhinitis due to pollen: Secondary | ICD-10-CM | POA: Diagnosis not present

## 2020-06-07 DIAGNOSIS — J301 Allergic rhinitis due to pollen: Secondary | ICD-10-CM | POA: Diagnosis not present

## 2020-06-07 DIAGNOSIS — J3089 Other allergic rhinitis: Secondary | ICD-10-CM | POA: Diagnosis not present

## 2020-06-13 DIAGNOSIS — J3089 Other allergic rhinitis: Secondary | ICD-10-CM | POA: Diagnosis not present

## 2020-06-13 DIAGNOSIS — J301 Allergic rhinitis due to pollen: Secondary | ICD-10-CM | POA: Diagnosis not present

## 2020-06-20 DIAGNOSIS — J3081 Allergic rhinitis due to animal (cat) (dog) hair and dander: Secondary | ICD-10-CM | POA: Diagnosis not present

## 2020-06-20 DIAGNOSIS — J301 Allergic rhinitis due to pollen: Secondary | ICD-10-CM | POA: Diagnosis not present

## 2020-06-20 DIAGNOSIS — J3089 Other allergic rhinitis: Secondary | ICD-10-CM | POA: Diagnosis not present

## 2020-06-27 DIAGNOSIS — J3089 Other allergic rhinitis: Secondary | ICD-10-CM | POA: Diagnosis not present

## 2020-06-27 DIAGNOSIS — J301 Allergic rhinitis due to pollen: Secondary | ICD-10-CM | POA: Diagnosis not present

## 2020-07-04 DIAGNOSIS — J3089 Other allergic rhinitis: Secondary | ICD-10-CM | POA: Diagnosis not present

## 2020-07-04 DIAGNOSIS — J3081 Allergic rhinitis due to animal (cat) (dog) hair and dander: Secondary | ICD-10-CM | POA: Diagnosis not present

## 2020-07-04 DIAGNOSIS — J301 Allergic rhinitis due to pollen: Secondary | ICD-10-CM | POA: Diagnosis not present

## 2020-07-11 DIAGNOSIS — J301 Allergic rhinitis due to pollen: Secondary | ICD-10-CM | POA: Diagnosis not present

## 2020-07-11 DIAGNOSIS — J3089 Other allergic rhinitis: Secondary | ICD-10-CM | POA: Diagnosis not present

## 2020-07-18 DIAGNOSIS — J3089 Other allergic rhinitis: Secondary | ICD-10-CM | POA: Diagnosis not present

## 2020-07-18 DIAGNOSIS — J301 Allergic rhinitis due to pollen: Secondary | ICD-10-CM | POA: Diagnosis not present

## 2020-07-25 DIAGNOSIS — J3089 Other allergic rhinitis: Secondary | ICD-10-CM | POA: Diagnosis not present

## 2020-07-25 DIAGNOSIS — J301 Allergic rhinitis due to pollen: Secondary | ICD-10-CM | POA: Diagnosis not present

## 2020-08-01 DIAGNOSIS — J301 Allergic rhinitis due to pollen: Secondary | ICD-10-CM | POA: Diagnosis not present

## 2020-08-01 DIAGNOSIS — J3089 Other allergic rhinitis: Secondary | ICD-10-CM | POA: Diagnosis not present

## 2020-08-08 DIAGNOSIS — J301 Allergic rhinitis due to pollen: Secondary | ICD-10-CM | POA: Diagnosis not present

## 2020-08-08 DIAGNOSIS — J3089 Other allergic rhinitis: Secondary | ICD-10-CM | POA: Diagnosis not present

## 2020-08-15 DIAGNOSIS — J301 Allergic rhinitis due to pollen: Secondary | ICD-10-CM | POA: Diagnosis not present

## 2020-08-15 DIAGNOSIS — J3089 Other allergic rhinitis: Secondary | ICD-10-CM | POA: Diagnosis not present

## 2020-08-22 DIAGNOSIS — J3089 Other allergic rhinitis: Secondary | ICD-10-CM | POA: Diagnosis not present

## 2020-08-22 DIAGNOSIS — J301 Allergic rhinitis due to pollen: Secondary | ICD-10-CM | POA: Diagnosis not present

## 2020-08-29 DIAGNOSIS — J301 Allergic rhinitis due to pollen: Secondary | ICD-10-CM | POA: Diagnosis not present

## 2020-08-29 DIAGNOSIS — J3089 Other allergic rhinitis: Secondary | ICD-10-CM | POA: Diagnosis not present

## 2020-09-06 DIAGNOSIS — J301 Allergic rhinitis due to pollen: Secondary | ICD-10-CM | POA: Diagnosis not present

## 2020-09-06 DIAGNOSIS — J3089 Other allergic rhinitis: Secondary | ICD-10-CM | POA: Diagnosis not present

## 2020-09-19 DIAGNOSIS — J3089 Other allergic rhinitis: Secondary | ICD-10-CM | POA: Diagnosis not present

## 2020-09-19 DIAGNOSIS — J301 Allergic rhinitis due to pollen: Secondary | ICD-10-CM | POA: Diagnosis not present

## 2020-09-26 DIAGNOSIS — J301 Allergic rhinitis due to pollen: Secondary | ICD-10-CM | POA: Diagnosis not present

## 2020-09-26 DIAGNOSIS — J3089 Other allergic rhinitis: Secondary | ICD-10-CM | POA: Diagnosis not present

## 2020-09-28 DIAGNOSIS — J3089 Other allergic rhinitis: Secondary | ICD-10-CM | POA: Diagnosis not present

## 2020-09-28 DIAGNOSIS — J301 Allergic rhinitis due to pollen: Secondary | ICD-10-CM | POA: Diagnosis not present

## 2020-10-09 DIAGNOSIS — J301 Allergic rhinitis due to pollen: Secondary | ICD-10-CM | POA: Diagnosis not present

## 2020-10-09 DIAGNOSIS — J3089 Other allergic rhinitis: Secondary | ICD-10-CM | POA: Diagnosis not present

## 2020-10-17 DIAGNOSIS — J3089 Other allergic rhinitis: Secondary | ICD-10-CM | POA: Diagnosis not present

## 2020-10-17 DIAGNOSIS — J301 Allergic rhinitis due to pollen: Secondary | ICD-10-CM | POA: Diagnosis not present

## 2020-10-22 DIAGNOSIS — J3089 Other allergic rhinitis: Secondary | ICD-10-CM | POA: Diagnosis not present

## 2020-10-22 DIAGNOSIS — J301 Allergic rhinitis due to pollen: Secondary | ICD-10-CM | POA: Diagnosis not present

## 2020-10-22 DIAGNOSIS — J3081 Allergic rhinitis due to animal (cat) (dog) hair and dander: Secondary | ICD-10-CM | POA: Diagnosis not present

## 2020-10-31 DIAGNOSIS — J3089 Other allergic rhinitis: Secondary | ICD-10-CM | POA: Diagnosis not present

## 2020-10-31 DIAGNOSIS — J301 Allergic rhinitis due to pollen: Secondary | ICD-10-CM | POA: Diagnosis not present

## 2020-11-07 DIAGNOSIS — J301 Allergic rhinitis due to pollen: Secondary | ICD-10-CM | POA: Diagnosis not present

## 2020-11-07 DIAGNOSIS — J3081 Allergic rhinitis due to animal (cat) (dog) hair and dander: Secondary | ICD-10-CM | POA: Diagnosis not present

## 2020-11-07 DIAGNOSIS — J3089 Other allergic rhinitis: Secondary | ICD-10-CM | POA: Diagnosis not present

## 2020-11-08 DIAGNOSIS — L209 Atopic dermatitis, unspecified: Secondary | ICD-10-CM | POA: Diagnosis not present

## 2020-11-12 DIAGNOSIS — B078 Other viral warts: Secondary | ICD-10-CM | POA: Diagnosis not present

## 2020-11-12 DIAGNOSIS — J3081 Allergic rhinitis due to animal (cat) (dog) hair and dander: Secondary | ICD-10-CM | POA: Diagnosis not present

## 2020-11-12 DIAGNOSIS — L814 Other melanin hyperpigmentation: Secondary | ICD-10-CM | POA: Diagnosis not present

## 2020-11-12 DIAGNOSIS — L821 Other seborrheic keratosis: Secondary | ICD-10-CM | POA: Diagnosis not present

## 2020-11-12 DIAGNOSIS — J3089 Other allergic rhinitis: Secondary | ICD-10-CM | POA: Diagnosis not present

## 2020-11-12 DIAGNOSIS — L819 Disorder of pigmentation, unspecified: Secondary | ICD-10-CM | POA: Diagnosis not present

## 2020-11-12 DIAGNOSIS — L570 Actinic keratosis: Secondary | ICD-10-CM | POA: Diagnosis not present

## 2020-11-12 DIAGNOSIS — J301 Allergic rhinitis due to pollen: Secondary | ICD-10-CM | POA: Diagnosis not present

## 2020-11-12 DIAGNOSIS — D229 Melanocytic nevi, unspecified: Secondary | ICD-10-CM | POA: Diagnosis not present

## 2020-11-22 DIAGNOSIS — J3089 Other allergic rhinitis: Secondary | ICD-10-CM | POA: Diagnosis not present

## 2020-11-22 DIAGNOSIS — J301 Allergic rhinitis due to pollen: Secondary | ICD-10-CM | POA: Diagnosis not present

## 2020-11-28 DIAGNOSIS — J3089 Other allergic rhinitis: Secondary | ICD-10-CM | POA: Diagnosis not present

## 2020-11-28 DIAGNOSIS — J301 Allergic rhinitis due to pollen: Secondary | ICD-10-CM | POA: Diagnosis not present

## 2020-12-12 DIAGNOSIS — J3089 Other allergic rhinitis: Secondary | ICD-10-CM | POA: Diagnosis not present

## 2020-12-12 DIAGNOSIS — J301 Allergic rhinitis due to pollen: Secondary | ICD-10-CM | POA: Diagnosis not present

## 2020-12-13 DIAGNOSIS — E7800 Pure hypercholesterolemia, unspecified: Secondary | ICD-10-CM | POA: Diagnosis not present

## 2020-12-13 DIAGNOSIS — I10 Essential (primary) hypertension: Secondary | ICD-10-CM | POA: Diagnosis not present

## 2020-12-17 DIAGNOSIS — R058 Other specified cough: Secondary | ICD-10-CM | POA: Diagnosis not present

## 2020-12-17 DIAGNOSIS — J45901 Unspecified asthma with (acute) exacerbation: Secondary | ICD-10-CM | POA: Diagnosis not present

## 2020-12-19 DIAGNOSIS — J301 Allergic rhinitis due to pollen: Secondary | ICD-10-CM | POA: Diagnosis not present

## 2020-12-19 DIAGNOSIS — J3089 Other allergic rhinitis: Secondary | ICD-10-CM | POA: Diagnosis not present

## 2020-12-25 DIAGNOSIS — J301 Allergic rhinitis due to pollen: Secondary | ICD-10-CM | POA: Diagnosis not present

## 2020-12-25 DIAGNOSIS — R21 Rash and other nonspecific skin eruption: Secondary | ICD-10-CM | POA: Diagnosis not present

## 2020-12-25 DIAGNOSIS — J3089 Other allergic rhinitis: Secondary | ICD-10-CM | POA: Diagnosis not present

## 2020-12-25 DIAGNOSIS — J4530 Mild persistent asthma, uncomplicated: Secondary | ICD-10-CM | POA: Diagnosis not present

## 2020-12-27 DIAGNOSIS — E7800 Pure hypercholesterolemia, unspecified: Secondary | ICD-10-CM | POA: Diagnosis not present

## 2020-12-27 DIAGNOSIS — I10 Essential (primary) hypertension: Secondary | ICD-10-CM | POA: Diagnosis not present

## 2021-01-01 DIAGNOSIS — J301 Allergic rhinitis due to pollen: Secondary | ICD-10-CM | POA: Diagnosis not present

## 2021-01-01 DIAGNOSIS — J3089 Other allergic rhinitis: Secondary | ICD-10-CM | POA: Diagnosis not present

## 2021-01-02 DIAGNOSIS — K648 Other hemorrhoids: Secondary | ICD-10-CM | POA: Diagnosis not present

## 2021-01-02 DIAGNOSIS — D124 Benign neoplasm of descending colon: Secondary | ICD-10-CM | POA: Diagnosis not present

## 2021-01-02 DIAGNOSIS — Z1211 Encounter for screening for malignant neoplasm of colon: Secondary | ICD-10-CM | POA: Diagnosis not present

## 2021-01-09 DIAGNOSIS — J301 Allergic rhinitis due to pollen: Secondary | ICD-10-CM | POA: Diagnosis not present

## 2021-01-10 DIAGNOSIS — J301 Allergic rhinitis due to pollen: Secondary | ICD-10-CM | POA: Diagnosis not present

## 2021-01-10 DIAGNOSIS — J3089 Other allergic rhinitis: Secondary | ICD-10-CM | POA: Diagnosis not present

## 2021-01-16 DIAGNOSIS — L309 Dermatitis, unspecified: Secondary | ICD-10-CM | POA: Diagnosis not present

## 2021-01-16 DIAGNOSIS — J45909 Unspecified asthma, uncomplicated: Secondary | ICD-10-CM | POA: Diagnosis not present

## 2021-01-16 DIAGNOSIS — J3089 Other allergic rhinitis: Secondary | ICD-10-CM | POA: Diagnosis not present

## 2021-01-16 DIAGNOSIS — N529 Male erectile dysfunction, unspecified: Secondary | ICD-10-CM | POA: Diagnosis not present

## 2021-01-16 DIAGNOSIS — I10 Essential (primary) hypertension: Secondary | ICD-10-CM | POA: Diagnosis not present

## 2021-01-16 DIAGNOSIS — J301 Allergic rhinitis due to pollen: Secondary | ICD-10-CM | POA: Diagnosis not present

## 2021-01-16 DIAGNOSIS — E538 Deficiency of other specified B group vitamins: Secondary | ICD-10-CM | POA: Diagnosis not present

## 2021-01-16 DIAGNOSIS — E782 Mixed hyperlipidemia: Secondary | ICD-10-CM | POA: Diagnosis not present

## 2021-01-16 DIAGNOSIS — E559 Vitamin D deficiency, unspecified: Secondary | ICD-10-CM | POA: Diagnosis not present

## 2021-01-23 DIAGNOSIS — J301 Allergic rhinitis due to pollen: Secondary | ICD-10-CM | POA: Diagnosis not present

## 2021-01-23 DIAGNOSIS — J3089 Other allergic rhinitis: Secondary | ICD-10-CM | POA: Diagnosis not present

## 2021-01-30 DIAGNOSIS — J301 Allergic rhinitis due to pollen: Secondary | ICD-10-CM | POA: Diagnosis not present

## 2021-01-30 DIAGNOSIS — J3089 Other allergic rhinitis: Secondary | ICD-10-CM | POA: Diagnosis not present

## 2021-02-06 DIAGNOSIS — J3089 Other allergic rhinitis: Secondary | ICD-10-CM | POA: Diagnosis not present

## 2021-02-06 DIAGNOSIS — J301 Allergic rhinitis due to pollen: Secondary | ICD-10-CM | POA: Diagnosis not present

## 2021-02-13 DIAGNOSIS — J3089 Other allergic rhinitis: Secondary | ICD-10-CM | POA: Diagnosis not present

## 2021-02-13 DIAGNOSIS — J301 Allergic rhinitis due to pollen: Secondary | ICD-10-CM | POA: Diagnosis not present

## 2021-02-19 DIAGNOSIS — J45909 Unspecified asthma, uncomplicated: Secondary | ICD-10-CM | POA: Diagnosis not present

## 2021-02-19 DIAGNOSIS — E782 Mixed hyperlipidemia: Secondary | ICD-10-CM | POA: Diagnosis not present

## 2021-02-19 DIAGNOSIS — I10 Essential (primary) hypertension: Secondary | ICD-10-CM | POA: Diagnosis not present

## 2021-02-19 DIAGNOSIS — T7840XA Allergy, unspecified, initial encounter: Secondary | ICD-10-CM | POA: Diagnosis not present

## 2021-02-20 DIAGNOSIS — J3089 Other allergic rhinitis: Secondary | ICD-10-CM | POA: Diagnosis not present

## 2021-02-20 DIAGNOSIS — J3081 Allergic rhinitis due to animal (cat) (dog) hair and dander: Secondary | ICD-10-CM | POA: Diagnosis not present

## 2021-02-20 DIAGNOSIS — J301 Allergic rhinitis due to pollen: Secondary | ICD-10-CM | POA: Diagnosis not present

## 2021-02-27 DIAGNOSIS — J301 Allergic rhinitis due to pollen: Secondary | ICD-10-CM | POA: Diagnosis not present

## 2021-02-27 DIAGNOSIS — J3089 Other allergic rhinitis: Secondary | ICD-10-CM | POA: Diagnosis not present

## 2021-03-21 DIAGNOSIS — J301 Allergic rhinitis due to pollen: Secondary | ICD-10-CM | POA: Diagnosis not present

## 2021-03-21 DIAGNOSIS — J3089 Other allergic rhinitis: Secondary | ICD-10-CM | POA: Diagnosis not present

## 2021-03-27 DIAGNOSIS — J301 Allergic rhinitis due to pollen: Secondary | ICD-10-CM | POA: Diagnosis not present

## 2021-03-27 DIAGNOSIS — J3089 Other allergic rhinitis: Secondary | ICD-10-CM | POA: Diagnosis not present

## 2021-04-03 DIAGNOSIS — J3089 Other allergic rhinitis: Secondary | ICD-10-CM | POA: Diagnosis not present

## 2021-04-03 DIAGNOSIS — J301 Allergic rhinitis due to pollen: Secondary | ICD-10-CM | POA: Diagnosis not present

## 2021-04-10 DIAGNOSIS — J301 Allergic rhinitis due to pollen: Secondary | ICD-10-CM | POA: Diagnosis not present

## 2021-04-10 DIAGNOSIS — J3089 Other allergic rhinitis: Secondary | ICD-10-CM | POA: Diagnosis not present

## 2021-04-17 DIAGNOSIS — J3089 Other allergic rhinitis: Secondary | ICD-10-CM | POA: Diagnosis not present

## 2021-04-17 DIAGNOSIS — J301 Allergic rhinitis due to pollen: Secondary | ICD-10-CM | POA: Diagnosis not present

## 2021-04-24 DIAGNOSIS — J301 Allergic rhinitis due to pollen: Secondary | ICD-10-CM | POA: Diagnosis not present

## 2021-04-24 DIAGNOSIS — J3089 Other allergic rhinitis: Secondary | ICD-10-CM | POA: Diagnosis not present

## 2021-05-01 DIAGNOSIS — J301 Allergic rhinitis due to pollen: Secondary | ICD-10-CM | POA: Diagnosis not present

## 2021-05-01 DIAGNOSIS — J3089 Other allergic rhinitis: Secondary | ICD-10-CM | POA: Diagnosis not present

## 2021-05-08 DIAGNOSIS — J3089 Other allergic rhinitis: Secondary | ICD-10-CM | POA: Diagnosis not present

## 2021-05-08 DIAGNOSIS — J301 Allergic rhinitis due to pollen: Secondary | ICD-10-CM | POA: Diagnosis not present

## 2021-05-16 DIAGNOSIS — J301 Allergic rhinitis due to pollen: Secondary | ICD-10-CM | POA: Diagnosis not present

## 2021-05-16 DIAGNOSIS — J3089 Other allergic rhinitis: Secondary | ICD-10-CM | POA: Diagnosis not present

## 2021-05-29 DIAGNOSIS — J3089 Other allergic rhinitis: Secondary | ICD-10-CM | POA: Diagnosis not present

## 2021-05-29 DIAGNOSIS — J301 Allergic rhinitis due to pollen: Secondary | ICD-10-CM | POA: Diagnosis not present

## 2021-06-05 DIAGNOSIS — J301 Allergic rhinitis due to pollen: Secondary | ICD-10-CM | POA: Diagnosis not present

## 2021-06-05 DIAGNOSIS — J3089 Other allergic rhinitis: Secondary | ICD-10-CM | POA: Diagnosis not present

## 2021-06-05 DIAGNOSIS — J3081 Allergic rhinitis due to animal (cat) (dog) hair and dander: Secondary | ICD-10-CM | POA: Diagnosis not present

## 2021-06-12 DIAGNOSIS — J301 Allergic rhinitis due to pollen: Secondary | ICD-10-CM | POA: Diagnosis not present

## 2021-06-12 DIAGNOSIS — J3089 Other allergic rhinitis: Secondary | ICD-10-CM | POA: Diagnosis not present

## 2021-06-27 DIAGNOSIS — J301 Allergic rhinitis due to pollen: Secondary | ICD-10-CM | POA: Diagnosis not present

## 2021-06-27 DIAGNOSIS — J3089 Other allergic rhinitis: Secondary | ICD-10-CM | POA: Diagnosis not present

## 2021-06-27 DIAGNOSIS — J3081 Allergic rhinitis due to animal (cat) (dog) hair and dander: Secondary | ICD-10-CM | POA: Diagnosis not present

## 2021-06-28 DIAGNOSIS — J45909 Unspecified asthma, uncomplicated: Secondary | ICD-10-CM | POA: Diagnosis not present

## 2021-06-28 DIAGNOSIS — K909 Intestinal malabsorption, unspecified: Secondary | ICD-10-CM | POA: Diagnosis not present

## 2021-06-28 DIAGNOSIS — I10 Essential (primary) hypertension: Secondary | ICD-10-CM | POA: Diagnosis not present

## 2021-06-28 DIAGNOSIS — T7840XA Allergy, unspecified, initial encounter: Secondary | ICD-10-CM | POA: Diagnosis not present

## 2021-06-28 DIAGNOSIS — E782 Mixed hyperlipidemia: Secondary | ICD-10-CM | POA: Diagnosis not present

## 2021-07-03 DIAGNOSIS — J3089 Other allergic rhinitis: Secondary | ICD-10-CM | POA: Diagnosis not present

## 2021-07-03 DIAGNOSIS — J301 Allergic rhinitis due to pollen: Secondary | ICD-10-CM | POA: Diagnosis not present

## 2021-07-04 DIAGNOSIS — N529 Male erectile dysfunction, unspecified: Secondary | ICD-10-CM | POA: Diagnosis not present

## 2021-07-04 DIAGNOSIS — J301 Allergic rhinitis due to pollen: Secondary | ICD-10-CM | POA: Diagnosis not present

## 2021-07-04 DIAGNOSIS — Z130 Encounter for screening for diseases of the blood and blood-forming organs and certain disorders involving the immune mechanism: Secondary | ICD-10-CM | POA: Diagnosis not present

## 2021-07-04 DIAGNOSIS — Z5181 Encounter for therapeutic drug level monitoring: Secondary | ICD-10-CM | POA: Diagnosis not present

## 2021-07-04 DIAGNOSIS — R000 Tachycardia, unspecified: Secondary | ICD-10-CM | POA: Diagnosis not present

## 2021-07-04 DIAGNOSIS — Z0000 Encounter for general adult medical examination without abnormal findings: Secondary | ICD-10-CM | POA: Diagnosis not present

## 2021-07-04 DIAGNOSIS — Z125 Encounter for screening for malignant neoplasm of prostate: Secondary | ICD-10-CM | POA: Diagnosis not present

## 2021-07-04 DIAGNOSIS — E7800 Pure hypercholesterolemia, unspecified: Secondary | ICD-10-CM | POA: Diagnosis not present

## 2021-07-06 DIAGNOSIS — J3089 Other allergic rhinitis: Secondary | ICD-10-CM | POA: Diagnosis not present

## 2021-07-10 DIAGNOSIS — J3089 Other allergic rhinitis: Secondary | ICD-10-CM | POA: Diagnosis not present

## 2021-07-10 DIAGNOSIS — J3081 Allergic rhinitis due to animal (cat) (dog) hair and dander: Secondary | ICD-10-CM | POA: Diagnosis not present

## 2021-07-10 DIAGNOSIS — J301 Allergic rhinitis due to pollen: Secondary | ICD-10-CM | POA: Diagnosis not present

## 2021-07-17 DIAGNOSIS — J3089 Other allergic rhinitis: Secondary | ICD-10-CM | POA: Diagnosis not present

## 2021-07-17 DIAGNOSIS — J301 Allergic rhinitis due to pollen: Secondary | ICD-10-CM | POA: Diagnosis not present

## 2021-07-17 DIAGNOSIS — J3081 Allergic rhinitis due to animal (cat) (dog) hair and dander: Secondary | ICD-10-CM | POA: Diagnosis not present

## 2021-07-26 DIAGNOSIS — J3089 Other allergic rhinitis: Secondary | ICD-10-CM | POA: Diagnosis not present

## 2021-07-26 DIAGNOSIS — J301 Allergic rhinitis due to pollen: Secondary | ICD-10-CM | POA: Diagnosis not present

## 2021-07-30 DIAGNOSIS — J301 Allergic rhinitis due to pollen: Secondary | ICD-10-CM | POA: Diagnosis not present

## 2021-07-30 DIAGNOSIS — J3089 Other allergic rhinitis: Secondary | ICD-10-CM | POA: Diagnosis not present

## 2021-08-07 DIAGNOSIS — J3089 Other allergic rhinitis: Secondary | ICD-10-CM | POA: Diagnosis not present

## 2021-08-07 DIAGNOSIS — J3081 Allergic rhinitis due to animal (cat) (dog) hair and dander: Secondary | ICD-10-CM | POA: Diagnosis not present

## 2021-08-07 DIAGNOSIS — J301 Allergic rhinitis due to pollen: Secondary | ICD-10-CM | POA: Diagnosis not present

## 2021-08-09 DIAGNOSIS — Z23 Encounter for immunization: Secondary | ICD-10-CM | POA: Diagnosis not present

## 2021-08-14 DIAGNOSIS — I10 Essential (primary) hypertension: Secondary | ICD-10-CM | POA: Diagnosis not present

## 2021-08-14 DIAGNOSIS — J4530 Mild persistent asthma, uncomplicated: Secondary | ICD-10-CM | POA: Diagnosis not present

## 2021-08-14 DIAGNOSIS — G4719 Other hypersomnia: Secondary | ICD-10-CM | POA: Diagnosis not present

## 2021-08-14 DIAGNOSIS — J3089 Other allergic rhinitis: Secondary | ICD-10-CM | POA: Diagnosis not present

## 2021-08-14 DIAGNOSIS — J301 Allergic rhinitis due to pollen: Secondary | ICD-10-CM | POA: Diagnosis not present

## 2021-08-21 ENCOUNTER — Ambulatory Visit: Payer: BC Managed Care – PPO | Admitting: Internal Medicine

## 2021-08-21 ENCOUNTER — Encounter: Payer: Self-pay | Admitting: Internal Medicine

## 2021-08-21 VITALS — BP 122/86 | HR 57 | Ht 69.0 in | Wt 161.4 lb

## 2021-08-21 DIAGNOSIS — J3089 Other allergic rhinitis: Secondary | ICD-10-CM | POA: Diagnosis not present

## 2021-08-21 DIAGNOSIS — R0609 Other forms of dyspnea: Secondary | ICD-10-CM | POA: Diagnosis not present

## 2021-08-21 DIAGNOSIS — J3081 Allergic rhinitis due to animal (cat) (dog) hair and dander: Secondary | ICD-10-CM | POA: Diagnosis not present

## 2021-08-21 DIAGNOSIS — R001 Bradycardia, unspecified: Secondary | ICD-10-CM | POA: Diagnosis not present

## 2021-08-21 DIAGNOSIS — J301 Allergic rhinitis due to pollen: Secondary | ICD-10-CM | POA: Diagnosis not present

## 2021-08-21 NOTE — Progress Notes (Signed)
ELECTROPHYSIOLOGY CONSULT NOTE  Patient ID: David Olsen, MRN: [id number], DOB/AGE: 05/11/1960 61 y.o. Admit date: (Not on file) Date of Consult: 08/21/2021  Primary Physician: Ileana Ladd, MD Primary Cardiologist: new     David Olsen is a 61 y.o. male who is being seen today for the evaluation of dyspnea and elevated HR with exercise  at the request of Dr Annell Greening.     HPI David Olsen is a 61 y.o. male referred because of awareness of increased heart rates with running.  Longstanding recreational runner with 4 years of data noting that his normal exertional rate is about 140-150.  Over the last year or so he is noted that the increase in rate is faster and the decrease in rate following exercise is slower.  Moreover, his route has a hill at its onset, and he is found that he has had to walk up this hill and has had more shortness of breath going up the hill over the same period of time.  In addition, if he walks up the hill and then undertakes his run he has a sense that his heart rate is less fast.  No associated chest discomfort.  Hypercholesterolemic.  Father had CABG in his 23s.  Low-grade hypertension. Asthma and allergic diathesis.  DATE TEST EF                    Date Cr K Hgb    4/23 1.02  5.3 14.1         Sleep disordered breathing sleep study pending  Past Medical History:  Diagnosis Date   Asthma    Eczema    Elevated LDL cholesterol level    Hemorrhoids    Hypercholesterolemia    White coat syndrome without hypertension       Surgical History:  Past Surgical History:  Procedure Laterality Date   colonscopy  age 74 or 4   LAPAROSCOPIC APPENDECTOMY N/A 12/09/2013   Procedure: LAPAROSCOPIC INTERVAL APPENDECTOMY;  Surgeon: Avel Peace, MD;  Location: [facility];  Service: General;  Laterality: N/A;     Home Meds: Current Meds  Medication Sig   albuterol (PROVENTIL HFA;VENTOLIN HFA) 108 (90 BASE) MCG/ACT inhaler Inhale 1 puff into the lungs  every 6 (six) hours as needed for wheezing or shortness of breath.   fluticasone (FLONASE) 50 MCG/ACT nasal spray Place 1 spray into both nostrils every evening.    fluticasone furoate-vilanterol (BREO ELLIPTA) 200-25 MCG/ACT AEPB as directed.   glucosamine-chondroitin 500-400 MG tablet Take 1 tablet by mouth daily.   hydrOXYzine (ATARAX) 50 MG tablet Take 1 tablet by mouth every 8 (eight) hours as needed.   ibuprofen (ADVIL,MOTRIN) 200 MG tablet Take 400 mg by mouth every 6 (six) hours as needed for mild pain or moderate pain.   levocetirizine (XYZAL) 5 MG tablet Take 1 tablet by mouth at bedtime.   Multiple Vitamins-Minerals (MULTIVITAMIN WITH MINERALS) tablet Take 1 tablet by mouth daily.   omega-3 acid ethyl esters (LOVAZA) 1 G capsule Take 1 g by mouth daily.   Probiotic Product (PROBIOTIC DAILY PO) Take 1 tablet by mouth daily.    Allergies:  Allergies  Allergen Reactions   Grass Pollen(K-O-R-T-Swt Vern) Shortness Of Breath   Peanuts [Peanut Oil] Rash    Social History   Socioeconomic History   Marital status: Married    Spouse name: Not on file   Number of children: Not on file   Years of education:  Not on file   Highest education level: Not on file  Occupational History   Not on file  Tobacco Use   Smoking status: Light Smoker    Types: Cigars   Smokeless tobacco: Never  Substance and Sexual Activity   Alcohol use: Yes    Comment: occ   Drug use: No   Sexual activity: Not on file  Other Topics Concern   Not on file  Social History Narrative   Not on file   Social Determinants of Health   Financial Resource Strain: Not on file  Food Insecurity: Not on file  Transportation Needs: Not on file  Physical Activity: Not on file  Stress: Not on file  Social Connections: Not on file  Intimate Partner Violence: Not on file     Family History  Problem Relation Age of Onset   Cancer Mother    Coronary artery disease Father      ROS:  Please see the history of  present illness.     All other systems reviewed and negative.    Physical Exam: Blood pressure 122/86, pulse (!) 57, height 5\' 9"  (1.753 m), weight 161 lb 6.4 oz (73.2 kg), SpO2 95 %. General: Well developed, well nourished male in no acute distress. Head: Normocephalic, atraumatic, sclera non-icteric, no xanthomas, nares are without discharge. EENT: normal  Lymph Nodes:  none Neck: Negative for carotid bruits. JVD not elevated. Back:without scoliosis kyphosis Lungs: Clear bilaterally to auscultation without wheezes, rales, or rhonchi. Breathing is unlabored. Heart: RRR with S1 S2. No murmur . No rubs, or gallops appreciated. Abdomen: Soft, non-tender, non-distended with normoactive bowel sounds. No hepatomegaly. No rebound/guarding. No obvious abdominal masses. Msk:  Strength and tone appear normal for age. Extremities: No clubbing or cyanosis. No  edema.  Distal pedal pulses are 2+ and equal bilaterally. Skin: Warm and Dry Neuro: Alert and oriented X 3. CN III-XII intact Grossly normal sensory and motor function . Psych:  Responds to questions appropriately with a normal affect.        EKG: Sinus at 57 Interval 15/10/41   Assessment and Plan:  Change in exercise tolerance  Increased heart rate with exercise  Hypercholesterolemia   His heart rate tends to be fast compared to age-related norms.  Looking at data from a run in 2022, 25% of his heart rates were faster than his 100% predicted max.  This may be simply the variation of the population as it has been his norm for the last 3 to 4 years.  More notable for me is the fact that he has detected a change in exercise tolerance with more dyspnea on exertion and slower heart rate recovery despite ongoing training. I think we start with a calcium score.  In the event that this is abnormal, would undertake CTA.  In the event that it is normal, would undertake a stress echo.  His hyperlipidemia may or may not justify therapy; will  wait to see what his calcium score is.  His 10-year predicted risk is 7.4% right at the age of cholesterol therapy.  Sherryl Manges

## 2021-08-21 NOTE — Patient Instructions (Signed)
Medication Instructions:  Your physician recommends that you continue on your current medications as directed. Please refer to the Current Medication list given to you today.  *If you need a refill on your cardiac medications before your next appointment, please call your pharmacy*   Lab Work: None ordered.  If you have labs (blood work) drawn today and your tests are completely normal, you will receive your results only by: MyChart Message (if you have MyChart) OR A paper copy in the mail If you have any lab test that is abnormal or we need to change your treatment, we will call you to review the results.   Testing/Procedures: Dr Graciela Husbands would like for you to schedule a CT Calcium Score.   Follow-Up: At [facility], you and your health needs are our priority.  As part of our continuing mission to provide you with exceptional heart care, we have created designated Provider Care Teams.  These Care Teams include your primary Cardiologist (physician) and Advanced Practice Providers (APPs -  Physician Assistants and Nurse Practitioners) who all work together to provide you with the care you need, when you need it.  We recommend signing up for the patient portal called "MyChart".  Sign up information is provided on this After Visit Summary.  MyChart is used to connect with patients for Virtual Visits (Telemedicine).  Patients are able to view lab/test results, encounter notes, upcoming appointments, etc.  Non-urgent messages can be sent to your provider as well.   To learn more about what you can do with MyChart, go to [email or web address].    Your next appointment:   Follow up pending test results  Important Information About Sugar

## 2021-09-09 ENCOUNTER — Ambulatory Visit
Admission: RE | Admit: 2021-09-09 | Discharge: 2021-09-09 | Disposition: A | Discharge status: Home / Self Care | Payer: Self-pay | Source: Ambulatory Visit | Attending: Internal Medicine | Admitting: Internal Medicine

## 2021-09-09 DIAGNOSIS — R0609 Other forms of dyspnea: Secondary | ICD-10-CM

## 2021-09-09 DIAGNOSIS — J301 Allergic rhinitis due to pollen: Secondary | ICD-10-CM | POA: Diagnosis not present

## 2021-09-09 DIAGNOSIS — J3089 Other allergic rhinitis: Secondary | ICD-10-CM | POA: Diagnosis not present

## 2021-09-10 DIAGNOSIS — E782 Mixed hyperlipidemia: Secondary | ICD-10-CM | POA: Diagnosis not present

## 2021-09-10 DIAGNOSIS — J45909 Unspecified asthma, uncomplicated: Secondary | ICD-10-CM | POA: Diagnosis not present

## 2021-09-10 DIAGNOSIS — K909 Intestinal malabsorption, unspecified: Secondary | ICD-10-CM | POA: Diagnosis not present

## 2021-09-10 DIAGNOSIS — I10 Essential (primary) hypertension: Secondary | ICD-10-CM | POA: Diagnosis not present

## 2021-09-12 DIAGNOSIS — J3081 Allergic rhinitis due to animal (cat) (dog) hair and dander: Secondary | ICD-10-CM | POA: Diagnosis not present

## 2021-09-12 DIAGNOSIS — J3089 Other allergic rhinitis: Secondary | ICD-10-CM | POA: Diagnosis not present

## 2021-09-12 DIAGNOSIS — J301 Allergic rhinitis due to pollen: Secondary | ICD-10-CM | POA: Diagnosis not present

## 2021-09-19 DIAGNOSIS — J301 Allergic rhinitis due to pollen: Secondary | ICD-10-CM | POA: Diagnosis not present

## 2021-09-19 DIAGNOSIS — J3089 Other allergic rhinitis: Secondary | ICD-10-CM | POA: Diagnosis not present

## 2021-09-19 DIAGNOSIS — J3081 Allergic rhinitis due to animal (cat) (dog) hair and dander: Secondary | ICD-10-CM | POA: Diagnosis not present

## 2021-09-23 DIAGNOSIS — I10 Essential (primary) hypertension: Secondary | ICD-10-CM | POA: Diagnosis not present

## 2021-09-23 DIAGNOSIS — G4733 Obstructive sleep apnea (adult) (pediatric): Secondary | ICD-10-CM | POA: Diagnosis not present

## 2021-09-23 DIAGNOSIS — J4530 Mild persistent asthma, uncomplicated: Secondary | ICD-10-CM | POA: Diagnosis not present

## 2021-09-25 DIAGNOSIS — J3089 Other allergic rhinitis: Secondary | ICD-10-CM | POA: Diagnosis not present

## 2021-09-25 DIAGNOSIS — J301 Allergic rhinitis due to pollen: Secondary | ICD-10-CM | POA: Diagnosis not present

## 2021-09-25 DIAGNOSIS — J3081 Allergic rhinitis due to animal (cat) (dog) hair and dander: Secondary | ICD-10-CM | POA: Diagnosis not present

## 2021-10-02 DIAGNOSIS — J301 Allergic rhinitis due to pollen: Secondary | ICD-10-CM | POA: Diagnosis not present

## 2021-10-02 DIAGNOSIS — J3089 Other allergic rhinitis: Secondary | ICD-10-CM | POA: Diagnosis not present

## 2021-10-02 DIAGNOSIS — R972 Elevated prostate specific antigen [PSA]: Secondary | ICD-10-CM | POA: Diagnosis not present

## 2021-10-08 DIAGNOSIS — J3081 Allergic rhinitis due to animal (cat) (dog) hair and dander: Secondary | ICD-10-CM | POA: Diagnosis not present

## 2021-10-08 DIAGNOSIS — J301 Allergic rhinitis due to pollen: Secondary | ICD-10-CM | POA: Diagnosis not present

## 2021-10-08 DIAGNOSIS — J3089 Other allergic rhinitis: Secondary | ICD-10-CM | POA: Diagnosis not present

## 2021-10-16 DIAGNOSIS — J301 Allergic rhinitis due to pollen: Secondary | ICD-10-CM | POA: Diagnosis not present

## 2021-10-16 DIAGNOSIS — J3089 Other allergic rhinitis: Secondary | ICD-10-CM | POA: Diagnosis not present

## 2021-10-22 DIAGNOSIS — J301 Allergic rhinitis due to pollen: Secondary | ICD-10-CM | POA: Diagnosis not present

## 2021-10-22 DIAGNOSIS — J3089 Other allergic rhinitis: Secondary | ICD-10-CM | POA: Diagnosis not present

## 2021-10-22 DIAGNOSIS — J3081 Allergic rhinitis due to animal (cat) (dog) hair and dander: Secondary | ICD-10-CM | POA: Diagnosis not present

## 2021-10-30 DIAGNOSIS — J3081 Allergic rhinitis due to animal (cat) (dog) hair and dander: Secondary | ICD-10-CM | POA: Diagnosis not present

## 2021-10-30 DIAGNOSIS — J3089 Other allergic rhinitis: Secondary | ICD-10-CM | POA: Diagnosis not present

## 2021-10-30 DIAGNOSIS — J301 Allergic rhinitis due to pollen: Secondary | ICD-10-CM | POA: Diagnosis not present

## 2021-11-04 DIAGNOSIS — J301 Allergic rhinitis due to pollen: Secondary | ICD-10-CM | POA: Diagnosis not present

## 2021-11-04 DIAGNOSIS — J3081 Allergic rhinitis due to animal (cat) (dog) hair and dander: Secondary | ICD-10-CM | POA: Diagnosis not present

## 2021-11-04 DIAGNOSIS — J3089 Other allergic rhinitis: Secondary | ICD-10-CM | POA: Diagnosis not present

## 2021-11-13 DIAGNOSIS — D492 Neoplasm of unspecified behavior of bone, soft tissue, and skin: Secondary | ICD-10-CM | POA: Diagnosis not present

## 2021-11-13 DIAGNOSIS — L448 Other specified papulosquamous disorders: Secondary | ICD-10-CM | POA: Diagnosis not present

## 2021-11-13 DIAGNOSIS — L570 Actinic keratosis: Secondary | ICD-10-CM | POA: Diagnosis not present

## 2021-11-13 DIAGNOSIS — L298 Other pruritus: Secondary | ICD-10-CM | POA: Diagnosis not present

## 2021-11-13 DIAGNOSIS — L821 Other seborrheic keratosis: Secondary | ICD-10-CM | POA: Diagnosis not present

## 2021-11-13 DIAGNOSIS — J301 Allergic rhinitis due to pollen: Secondary | ICD-10-CM | POA: Diagnosis not present

## 2021-11-13 DIAGNOSIS — D225 Melanocytic nevi of trunk: Secondary | ICD-10-CM | POA: Diagnosis not present

## 2021-11-13 DIAGNOSIS — L814 Other melanin hyperpigmentation: Secondary | ICD-10-CM | POA: Diagnosis not present

## 2021-11-13 DIAGNOSIS — J3089 Other allergic rhinitis: Secondary | ICD-10-CM | POA: Diagnosis not present

## 2021-11-20 DIAGNOSIS — J3089 Other allergic rhinitis: Secondary | ICD-10-CM | POA: Diagnosis not present

## 2021-11-20 DIAGNOSIS — J301 Allergic rhinitis due to pollen: Secondary | ICD-10-CM | POA: Diagnosis not present

## 2021-11-26 DIAGNOSIS — J3081 Allergic rhinitis due to animal (cat) (dog) hair and dander: Secondary | ICD-10-CM | POA: Diagnosis not present

## 2021-11-26 DIAGNOSIS — J301 Allergic rhinitis due to pollen: Secondary | ICD-10-CM | POA: Diagnosis not present

## 2021-11-26 DIAGNOSIS — J3089 Other allergic rhinitis: Secondary | ICD-10-CM | POA: Diagnosis not present

## 2021-12-04 DIAGNOSIS — J3089 Other allergic rhinitis: Secondary | ICD-10-CM | POA: Diagnosis not present

## 2021-12-04 DIAGNOSIS — J301 Allergic rhinitis due to pollen: Secondary | ICD-10-CM | POA: Diagnosis not present

## 2021-12-04 DIAGNOSIS — J3081 Allergic rhinitis due to animal (cat) (dog) hair and dander: Secondary | ICD-10-CM | POA: Diagnosis not present

## 2021-12-11 DIAGNOSIS — J3089 Other allergic rhinitis: Secondary | ICD-10-CM | POA: Diagnosis not present

## 2021-12-11 DIAGNOSIS — J301 Allergic rhinitis due to pollen: Secondary | ICD-10-CM | POA: Diagnosis not present

## 2021-12-11 DIAGNOSIS — J3081 Allergic rhinitis due to animal (cat) (dog) hair and dander: Secondary | ICD-10-CM | POA: Diagnosis not present

## 2021-12-18 DIAGNOSIS — J301 Allergic rhinitis due to pollen: Secondary | ICD-10-CM | POA: Diagnosis not present

## 2021-12-18 DIAGNOSIS — J3081 Allergic rhinitis due to animal (cat) (dog) hair and dander: Secondary | ICD-10-CM | POA: Diagnosis not present

## 2021-12-18 DIAGNOSIS — J3089 Other allergic rhinitis: Secondary | ICD-10-CM | POA: Diagnosis not present

## 2021-12-25 DIAGNOSIS — J3081 Allergic rhinitis due to animal (cat) (dog) hair and dander: Secondary | ICD-10-CM | POA: Diagnosis not present

## 2021-12-25 DIAGNOSIS — J4530 Mild persistent asthma, uncomplicated: Secondary | ICD-10-CM | POA: Diagnosis not present

## 2021-12-25 DIAGNOSIS — J301 Allergic rhinitis due to pollen: Secondary | ICD-10-CM | POA: Diagnosis not present

## 2021-12-25 DIAGNOSIS — R21 Rash and other nonspecific skin eruption: Secondary | ICD-10-CM | POA: Diagnosis not present

## 2021-12-25 DIAGNOSIS — J3089 Other allergic rhinitis: Secondary | ICD-10-CM | POA: Diagnosis not present

## 2022-01-02 DIAGNOSIS — B351 Tinea unguium: Secondary | ICD-10-CM | POA: Diagnosis not present

## 2022-01-02 DIAGNOSIS — E7800 Pure hypercholesterolemia, unspecified: Secondary | ICD-10-CM | POA: Diagnosis not present

## 2022-01-02 DIAGNOSIS — I10 Essential (primary) hypertension: Secondary | ICD-10-CM | POA: Diagnosis not present

## 2022-01-08 DIAGNOSIS — J3089 Other allergic rhinitis: Secondary | ICD-10-CM | POA: Diagnosis not present

## 2022-01-08 DIAGNOSIS — J301 Allergic rhinitis due to pollen: Secondary | ICD-10-CM | POA: Diagnosis not present

## 2022-01-13 DIAGNOSIS — J301 Allergic rhinitis due to pollen: Secondary | ICD-10-CM | POA: Diagnosis not present

## 2022-01-14 DIAGNOSIS — J3089 Other allergic rhinitis: Secondary | ICD-10-CM | POA: Diagnosis not present

## 2022-01-15 DIAGNOSIS — J3081 Allergic rhinitis due to animal (cat) (dog) hair and dander: Secondary | ICD-10-CM | POA: Diagnosis not present

## 2022-01-15 DIAGNOSIS — J301 Allergic rhinitis due to pollen: Secondary | ICD-10-CM | POA: Diagnosis not present

## 2022-01-15 DIAGNOSIS — J3089 Other allergic rhinitis: Secondary | ICD-10-CM | POA: Diagnosis not present

## 2022-01-22 DIAGNOSIS — J3089 Other allergic rhinitis: Secondary | ICD-10-CM | POA: Diagnosis not present

## 2022-01-22 DIAGNOSIS — J3081 Allergic rhinitis due to animal (cat) (dog) hair and dander: Secondary | ICD-10-CM | POA: Diagnosis not present

## 2022-01-22 DIAGNOSIS — J301 Allergic rhinitis due to pollen: Secondary | ICD-10-CM | POA: Diagnosis not present

## 2022-02-05 DIAGNOSIS — R051 Acute cough: Secondary | ICD-10-CM | POA: Diagnosis not present

## 2022-02-05 DIAGNOSIS — J301 Allergic rhinitis due to pollen: Secondary | ICD-10-CM | POA: Diagnosis not present

## 2022-02-05 DIAGNOSIS — J3089 Other allergic rhinitis: Secondary | ICD-10-CM | POA: Diagnosis not present

## 2022-02-12 DIAGNOSIS — B351 Tinea unguium: Secondary | ICD-10-CM | POA: Diagnosis not present

## 2022-02-12 DIAGNOSIS — J3089 Other allergic rhinitis: Secondary | ICD-10-CM | POA: Diagnosis not present

## 2022-02-12 DIAGNOSIS — J301 Allergic rhinitis due to pollen: Secondary | ICD-10-CM | POA: Diagnosis not present

## 2022-02-19 DIAGNOSIS — J301 Allergic rhinitis due to pollen: Secondary | ICD-10-CM | POA: Diagnosis not present

## 2022-02-19 DIAGNOSIS — J3089 Other allergic rhinitis: Secondary | ICD-10-CM | POA: Diagnosis not present

## 2022-02-26 DIAGNOSIS — J301 Allergic rhinitis due to pollen: Secondary | ICD-10-CM | POA: Diagnosis not present

## 2022-02-26 DIAGNOSIS — J3081 Allergic rhinitis due to animal (cat) (dog) hair and dander: Secondary | ICD-10-CM | POA: Diagnosis not present

## 2022-02-26 DIAGNOSIS — J3089 Other allergic rhinitis: Secondary | ICD-10-CM | POA: Diagnosis not present

## 2022-03-05 DIAGNOSIS — J3089 Other allergic rhinitis: Secondary | ICD-10-CM | POA: Diagnosis not present

## 2022-03-05 DIAGNOSIS — J301 Allergic rhinitis due to pollen: Secondary | ICD-10-CM | POA: Diagnosis not present

## 2022-03-12 DIAGNOSIS — J301 Allergic rhinitis due to pollen: Secondary | ICD-10-CM | POA: Diagnosis not present

## 2022-03-12 DIAGNOSIS — J3089 Other allergic rhinitis: Secondary | ICD-10-CM | POA: Diagnosis not present

## 2022-03-12 DIAGNOSIS — J3081 Allergic rhinitis due to animal (cat) (dog) hair and dander: Secondary | ICD-10-CM | POA: Diagnosis not present

## 2022-03-20 DIAGNOSIS — J4541 Moderate persistent asthma with (acute) exacerbation: Secondary | ICD-10-CM | POA: Diagnosis not present

## 2022-03-20 DIAGNOSIS — J019 Acute sinusitis, unspecified: Secondary | ICD-10-CM | POA: Diagnosis not present

## 2022-04-02 DIAGNOSIS — J301 Allergic rhinitis due to pollen: Secondary | ICD-10-CM | POA: Diagnosis not present

## 2022-04-02 DIAGNOSIS — J3081 Allergic rhinitis due to animal (cat) (dog) hair and dander: Secondary | ICD-10-CM | POA: Diagnosis not present

## 2022-04-02 DIAGNOSIS — J3089 Other allergic rhinitis: Secondary | ICD-10-CM | POA: Diagnosis not present

## 2022-04-09 DIAGNOSIS — J301 Allergic rhinitis due to pollen: Secondary | ICD-10-CM | POA: Diagnosis not present

## 2022-04-09 DIAGNOSIS — J3089 Other allergic rhinitis: Secondary | ICD-10-CM | POA: Diagnosis not present

## 2022-04-09 DIAGNOSIS — J3081 Allergic rhinitis due to animal (cat) (dog) hair and dander: Secondary | ICD-10-CM | POA: Diagnosis not present

## 2022-04-18 DIAGNOSIS — J301 Allergic rhinitis due to pollen: Secondary | ICD-10-CM | POA: Diagnosis not present

## 2022-04-18 DIAGNOSIS — R21 Rash and other nonspecific skin eruption: Secondary | ICD-10-CM | POA: Diagnosis not present

## 2022-04-18 DIAGNOSIS — J3089 Other allergic rhinitis: Secondary | ICD-10-CM | POA: Diagnosis not present

## 2022-04-18 DIAGNOSIS — J4530 Mild persistent asthma, uncomplicated: Secondary | ICD-10-CM | POA: Diagnosis not present

## 2022-04-23 DIAGNOSIS — J3089 Other allergic rhinitis: Secondary | ICD-10-CM | POA: Diagnosis not present

## 2022-04-23 DIAGNOSIS — J301 Allergic rhinitis due to pollen: Secondary | ICD-10-CM | POA: Diagnosis not present

## 2022-04-23 DIAGNOSIS — J3081 Allergic rhinitis due to animal (cat) (dog) hair and dander: Secondary | ICD-10-CM | POA: Diagnosis not present

## 2022-05-06 DIAGNOSIS — J301 Allergic rhinitis due to pollen: Secondary | ICD-10-CM | POA: Diagnosis not present

## 2022-05-06 DIAGNOSIS — J3089 Other allergic rhinitis: Secondary | ICD-10-CM | POA: Diagnosis not present

## 2022-05-06 DIAGNOSIS — J3081 Allergic rhinitis due to animal (cat) (dog) hair and dander: Secondary | ICD-10-CM | POA: Diagnosis not present

## 2022-05-07 DIAGNOSIS — Z23 Encounter for immunization: Secondary | ICD-10-CM | POA: Diagnosis not present

## 2022-05-14 DIAGNOSIS — J301 Allergic rhinitis due to pollen: Secondary | ICD-10-CM | POA: Diagnosis not present

## 2022-05-14 DIAGNOSIS — J3089 Other allergic rhinitis: Secondary | ICD-10-CM | POA: Diagnosis not present

## 2022-05-14 DIAGNOSIS — J3081 Allergic rhinitis due to animal (cat) (dog) hair and dander: Secondary | ICD-10-CM | POA: Diagnosis not present

## 2022-05-21 DIAGNOSIS — J209 Acute bronchitis, unspecified: Secondary | ICD-10-CM | POA: Diagnosis not present

## 2022-05-21 DIAGNOSIS — J45998 Other asthma: Secondary | ICD-10-CM | POA: Diagnosis not present

## 2022-05-21 DIAGNOSIS — R079 Chest pain, unspecified: Secondary | ICD-10-CM | POA: Diagnosis not present

## 2022-05-26 ENCOUNTER — Ambulatory Visit
Admission: RE | Admit: 2022-05-26 | Discharge: 2022-05-26 | Disposition: A | Discharge status: Home / Self Care | Payer: BC Managed Care – PPO | Source: Ambulatory Visit | Attending: Allergy | Admitting: Allergy

## 2022-05-26 ENCOUNTER — Other Ambulatory Visit: Payer: Self-pay | Admitting: Allergy

## 2022-05-26 DIAGNOSIS — J301 Allergic rhinitis due to pollen: Secondary | ICD-10-CM | POA: Diagnosis not present

## 2022-05-26 DIAGNOSIS — M795 Residual foreign body in soft tissue: Secondary | ICD-10-CM | POA: Diagnosis not present

## 2022-05-26 DIAGNOSIS — J209 Acute bronchitis, unspecified: Secondary | ICD-10-CM | POA: Diagnosis not present

## 2022-05-26 DIAGNOSIS — J4530 Mild persistent asthma, uncomplicated: Secondary | ICD-10-CM | POA: Diagnosis not present

## 2022-05-26 DIAGNOSIS — R21 Rash and other nonspecific skin eruption: Secondary | ICD-10-CM | POA: Diagnosis not present

## 2022-05-26 DIAGNOSIS — Z8709 Personal history of other diseases of the respiratory system: Secondary | ICD-10-CM | POA: Diagnosis not present

## 2022-05-26 DIAGNOSIS — R0602 Shortness of breath: Secondary | ICD-10-CM | POA: Diagnosis not present

## 2022-05-26 DIAGNOSIS — J3089 Other allergic rhinitis: Secondary | ICD-10-CM | POA: Diagnosis not present

## 2022-06-04 DIAGNOSIS — J3089 Other allergic rhinitis: Secondary | ICD-10-CM | POA: Diagnosis not present

## 2022-06-04 DIAGNOSIS — J3081 Allergic rhinitis due to animal (cat) (dog) hair and dander: Secondary | ICD-10-CM | POA: Diagnosis not present

## 2022-06-04 DIAGNOSIS — J301 Allergic rhinitis due to pollen: Secondary | ICD-10-CM | POA: Diagnosis not present

## 2022-06-13 DIAGNOSIS — J3081 Allergic rhinitis due to animal (cat) (dog) hair and dander: Secondary | ICD-10-CM | POA: Diagnosis not present

## 2022-06-13 DIAGNOSIS — J301 Allergic rhinitis due to pollen: Secondary | ICD-10-CM | POA: Diagnosis not present

## 2022-06-13 DIAGNOSIS — J3089 Other allergic rhinitis: Secondary | ICD-10-CM | POA: Diagnosis not present

## 2022-06-18 DIAGNOSIS — J342 Deviated nasal septum: Secondary | ICD-10-CM | POA: Diagnosis not present

## 2022-06-18 DIAGNOSIS — J310 Chronic rhinitis: Secondary | ICD-10-CM | POA: Diagnosis not present

## 2022-06-18 DIAGNOSIS — J343 Hypertrophy of nasal turbinates: Secondary | ICD-10-CM | POA: Diagnosis not present

## 2022-06-18 DIAGNOSIS — J301 Allergic rhinitis due to pollen: Secondary | ICD-10-CM | POA: Diagnosis not present

## 2022-06-18 DIAGNOSIS — J3081 Allergic rhinitis due to animal (cat) (dog) hair and dander: Secondary | ICD-10-CM | POA: Diagnosis not present

## 2022-06-18 DIAGNOSIS — J3089 Other allergic rhinitis: Secondary | ICD-10-CM | POA: Diagnosis not present

## 2022-06-25 DIAGNOSIS — J301 Allergic rhinitis due to pollen: Secondary | ICD-10-CM | POA: Diagnosis not present

## 2022-06-25 DIAGNOSIS — J3089 Other allergic rhinitis: Secondary | ICD-10-CM | POA: Diagnosis not present

## 2022-06-25 DIAGNOSIS — J3081 Allergic rhinitis due to animal (cat) (dog) hair and dander: Secondary | ICD-10-CM | POA: Diagnosis not present

## 2022-07-01 ENCOUNTER — Other Ambulatory Visit: Payer: Self-pay | Admitting: Otolaryngology

## 2022-07-01 DIAGNOSIS — J329 Chronic sinusitis, unspecified: Secondary | ICD-10-CM

## 2022-07-02 DIAGNOSIS — J301 Allergic rhinitis due to pollen: Secondary | ICD-10-CM | POA: Diagnosis not present

## 2022-07-02 DIAGNOSIS — J3081 Allergic rhinitis due to animal (cat) (dog) hair and dander: Secondary | ICD-10-CM | POA: Diagnosis not present

## 2022-07-02 DIAGNOSIS — J3089 Other allergic rhinitis: Secondary | ICD-10-CM | POA: Diagnosis not present

## 2022-07-09 DIAGNOSIS — J3089 Other allergic rhinitis: Secondary | ICD-10-CM | POA: Diagnosis not present

## 2022-07-09 DIAGNOSIS — J301 Allergic rhinitis due to pollen: Secondary | ICD-10-CM | POA: Diagnosis not present

## 2022-07-14 DIAGNOSIS — J301 Allergic rhinitis due to pollen: Secondary | ICD-10-CM | POA: Diagnosis not present

## 2022-07-15 DIAGNOSIS — J3089 Other allergic rhinitis: Secondary | ICD-10-CM | POA: Diagnosis not present

## 2022-07-16 DIAGNOSIS — Z125 Encounter for screening for malignant neoplasm of prostate: Secondary | ICD-10-CM | POA: Diagnosis not present

## 2022-07-16 DIAGNOSIS — N529 Male erectile dysfunction, unspecified: Secondary | ICD-10-CM | POA: Diagnosis not present

## 2022-07-16 DIAGNOSIS — J4530 Mild persistent asthma, uncomplicated: Secondary | ICD-10-CM | POA: Diagnosis not present

## 2022-07-16 DIAGNOSIS — I10 Essential (primary) hypertension: Secondary | ICD-10-CM | POA: Diagnosis not present

## 2022-07-16 DIAGNOSIS — Z0000 Encounter for general adult medical examination without abnormal findings: Secondary | ICD-10-CM | POA: Diagnosis not present

## 2022-07-16 DIAGNOSIS — Z23 Encounter for immunization: Secondary | ICD-10-CM | POA: Diagnosis not present

## 2022-07-16 DIAGNOSIS — E7800 Pure hypercholesterolemia, unspecified: Secondary | ICD-10-CM | POA: Diagnosis not present

## 2022-07-18 DIAGNOSIS — J3081 Allergic rhinitis due to animal (cat) (dog) hair and dander: Secondary | ICD-10-CM | POA: Diagnosis not present

## 2022-07-18 DIAGNOSIS — J3089 Other allergic rhinitis: Secondary | ICD-10-CM | POA: Diagnosis not present

## 2022-07-18 DIAGNOSIS — J301 Allergic rhinitis due to pollen: Secondary | ICD-10-CM | POA: Diagnosis not present

## 2022-07-29 DIAGNOSIS — J3081 Allergic rhinitis due to animal (cat) (dog) hair and dander: Secondary | ICD-10-CM | POA: Diagnosis not present

## 2022-07-29 DIAGNOSIS — J3089 Other allergic rhinitis: Secondary | ICD-10-CM | POA: Diagnosis not present

## 2022-07-29 DIAGNOSIS — J301 Allergic rhinitis due to pollen: Secondary | ICD-10-CM | POA: Diagnosis not present

## 2022-08-01 ENCOUNTER — Ambulatory Visit
Admission: RE | Admit: 2022-08-01 | Discharge: 2022-08-01 | Disposition: A | Discharge status: Home / Self Care | Payer: BC Managed Care – PPO | Source: Ambulatory Visit | Attending: Otolaryngology | Admitting: Otolaryngology

## 2022-08-01 DIAGNOSIS — J329 Chronic sinusitis, unspecified: Secondary | ICD-10-CM | POA: Diagnosis not present

## 2022-08-05 DIAGNOSIS — R1319 Other dysphagia: Secondary | ICD-10-CM | POA: Diagnosis not present

## 2022-08-05 DIAGNOSIS — K219 Gastro-esophageal reflux disease without esophagitis: Secondary | ICD-10-CM | POA: Diagnosis not present

## 2022-08-07 ENCOUNTER — Other Ambulatory Visit: Payer: Self-pay | Admitting: Otolaryngology

## 2022-08-07 DIAGNOSIS — J3089 Other allergic rhinitis: Secondary | ICD-10-CM | POA: Diagnosis not present

## 2022-08-07 DIAGNOSIS — K219 Gastro-esophageal reflux disease without esophagitis: Secondary | ICD-10-CM

## 2022-08-07 DIAGNOSIS — J301 Allergic rhinitis due to pollen: Secondary | ICD-10-CM | POA: Diagnosis not present

## 2022-08-07 DIAGNOSIS — R1319 Other dysphagia: Secondary | ICD-10-CM

## 2022-08-07 DIAGNOSIS — J3081 Allergic rhinitis due to animal (cat) (dog) hair and dander: Secondary | ICD-10-CM | POA: Diagnosis not present

## 2022-08-14 DIAGNOSIS — J3089 Other allergic rhinitis: Secondary | ICD-10-CM | POA: Diagnosis not present

## 2022-08-14 DIAGNOSIS — J301 Allergic rhinitis due to pollen: Secondary | ICD-10-CM | POA: Diagnosis not present

## 2022-08-14 DIAGNOSIS — J3081 Allergic rhinitis due to animal (cat) (dog) hair and dander: Secondary | ICD-10-CM | POA: Diagnosis not present

## 2022-08-18 ENCOUNTER — Ambulatory Visit
Admission: RE | Admit: 2022-08-18 | Discharge: 2022-08-18 | Disposition: A | Discharge status: Home / Self Care | Payer: BC Managed Care – PPO | Source: Ambulatory Visit | Attending: Otolaryngology | Admitting: Otolaryngology

## 2022-08-18 DIAGNOSIS — K219 Gastro-esophageal reflux disease without esophagitis: Secondary | ICD-10-CM | POA: Diagnosis not present

## 2022-08-18 DIAGNOSIS — K449 Diaphragmatic hernia without obstruction or gangrene: Secondary | ICD-10-CM | POA: Diagnosis not present

## 2022-08-18 DIAGNOSIS — R1319 Other dysphagia: Secondary | ICD-10-CM

## 2022-08-18 DIAGNOSIS — R1310 Dysphagia, unspecified: Secondary | ICD-10-CM | POA: Diagnosis not present

## 2022-08-21 DIAGNOSIS — J301 Allergic rhinitis due to pollen: Secondary | ICD-10-CM | POA: Diagnosis not present

## 2022-08-21 DIAGNOSIS — J3089 Other allergic rhinitis: Secondary | ICD-10-CM | POA: Diagnosis not present

## 2022-08-21 DIAGNOSIS — J3081 Allergic rhinitis due to animal (cat) (dog) hair and dander: Secondary | ICD-10-CM | POA: Diagnosis not present

## 2022-08-27 DIAGNOSIS — J3089 Other allergic rhinitis: Secondary | ICD-10-CM | POA: Diagnosis not present

## 2022-08-27 DIAGNOSIS — J3081 Allergic rhinitis due to animal (cat) (dog) hair and dander: Secondary | ICD-10-CM | POA: Diagnosis not present

## 2022-08-27 DIAGNOSIS — J301 Allergic rhinitis due to pollen: Secondary | ICD-10-CM | POA: Diagnosis not present

## 2022-09-03 DIAGNOSIS — J301 Allergic rhinitis due to pollen: Secondary | ICD-10-CM | POA: Diagnosis not present

## 2022-09-03 DIAGNOSIS — J3089 Other allergic rhinitis: Secondary | ICD-10-CM | POA: Diagnosis not present

## 2022-09-03 DIAGNOSIS — J3081 Allergic rhinitis due to animal (cat) (dog) hair and dander: Secondary | ICD-10-CM | POA: Diagnosis not present

## 2022-09-05 DIAGNOSIS — R1310 Dysphagia, unspecified: Secondary | ICD-10-CM | POA: Diagnosis not present

## 2022-09-05 DIAGNOSIS — K219 Gastro-esophageal reflux disease without esophagitis: Secondary | ICD-10-CM | POA: Diagnosis not present

## 2022-09-10 DIAGNOSIS — J301 Allergic rhinitis due to pollen: Secondary | ICD-10-CM | POA: Diagnosis not present

## 2022-09-10 DIAGNOSIS — J3081 Allergic rhinitis due to animal (cat) (dog) hair and dander: Secondary | ICD-10-CM | POA: Diagnosis not present

## 2022-09-10 DIAGNOSIS — J3089 Other allergic rhinitis: Secondary | ICD-10-CM | POA: Diagnosis not present

## 2022-09-11 DIAGNOSIS — F4320 Adjustment disorder, unspecified: Secondary | ICD-10-CM | POA: Diagnosis not present

## 2022-09-18 DIAGNOSIS — J301 Allergic rhinitis due to pollen: Secondary | ICD-10-CM | POA: Diagnosis not present

## 2022-09-18 DIAGNOSIS — J3089 Other allergic rhinitis: Secondary | ICD-10-CM | POA: Diagnosis not present

## 2022-09-18 DIAGNOSIS — J3081 Allergic rhinitis due to animal (cat) (dog) hair and dander: Secondary | ICD-10-CM | POA: Diagnosis not present

## 2022-09-25 DIAGNOSIS — F4320 Adjustment disorder, unspecified: Secondary | ICD-10-CM | POA: Diagnosis not present

## 2022-09-30 DIAGNOSIS — L570 Actinic keratosis: Secondary | ICD-10-CM | POA: Diagnosis not present

## 2022-09-30 DIAGNOSIS — L814 Other melanin hyperpigmentation: Secondary | ICD-10-CM | POA: Diagnosis not present

## 2022-09-30 DIAGNOSIS — L2089 Other atopic dermatitis: Secondary | ICD-10-CM | POA: Diagnosis not present

## 2022-09-30 DIAGNOSIS — D225 Melanocytic nevi of trunk: Secondary | ICD-10-CM | POA: Diagnosis not present

## 2022-09-30 DIAGNOSIS — L821 Other seborrheic keratosis: Secondary | ICD-10-CM | POA: Diagnosis not present

## 2022-10-01 DIAGNOSIS — K209 Esophagitis, unspecified without bleeding: Secondary | ICD-10-CM | POA: Diagnosis not present

## 2022-10-01 DIAGNOSIS — J3089 Other allergic rhinitis: Secondary | ICD-10-CM | POA: Diagnosis not present

## 2022-10-01 DIAGNOSIS — K31A19 Gastric intestinal metaplasia without dysplasia, unspecified site: Secondary | ICD-10-CM | POA: Diagnosis not present

## 2022-10-01 DIAGNOSIS — K2289 Other specified disease of esophagus: Secondary | ICD-10-CM | POA: Diagnosis not present

## 2022-10-01 DIAGNOSIS — K2940 Chronic atrophic gastritis without bleeding: Secondary | ICD-10-CM | POA: Diagnosis not present

## 2022-10-01 DIAGNOSIS — K2930 Chronic superficial gastritis without bleeding: Secondary | ICD-10-CM | POA: Diagnosis not present

## 2022-10-01 DIAGNOSIS — K222 Esophageal obstruction: Secondary | ICD-10-CM | POA: Diagnosis not present

## 2022-10-01 DIAGNOSIS — J3081 Allergic rhinitis due to animal (cat) (dog) hair and dander: Secondary | ICD-10-CM | POA: Diagnosis not present

## 2022-10-01 DIAGNOSIS — R1310 Dysphagia, unspecified: Secondary | ICD-10-CM | POA: Diagnosis not present

## 2022-10-01 DIAGNOSIS — K219 Gastro-esophageal reflux disease without esophagitis: Secondary | ICD-10-CM | POA: Diagnosis not present

## 2022-10-01 DIAGNOSIS — K2970 Gastritis, unspecified, without bleeding: Secondary | ICD-10-CM | POA: Diagnosis not present

## 2022-10-01 DIAGNOSIS — J301 Allergic rhinitis due to pollen: Secondary | ICD-10-CM | POA: Diagnosis not present

## 2022-10-15 DIAGNOSIS — F4320 Adjustment disorder, unspecified: Secondary | ICD-10-CM | POA: Diagnosis not present

## 2022-10-15 DIAGNOSIS — J3081 Allergic rhinitis due to animal (cat) (dog) hair and dander: Secondary | ICD-10-CM | POA: Diagnosis not present

## 2022-10-15 DIAGNOSIS — J3089 Other allergic rhinitis: Secondary | ICD-10-CM | POA: Diagnosis not present

## 2022-10-15 DIAGNOSIS — J301 Allergic rhinitis due to pollen: Secondary | ICD-10-CM | POA: Diagnosis not present

## 2022-10-16 DIAGNOSIS — N5201 Erectile dysfunction due to arterial insufficiency: Secondary | ICD-10-CM | POA: Diagnosis not present

## 2022-10-16 DIAGNOSIS — R972 Elevated prostate specific antigen [PSA]: Secondary | ICD-10-CM | POA: Diagnosis not present

## 2022-10-22 DIAGNOSIS — J301 Allergic rhinitis due to pollen: Secondary | ICD-10-CM | POA: Diagnosis not present

## 2022-10-22 DIAGNOSIS — J3089 Other allergic rhinitis: Secondary | ICD-10-CM | POA: Diagnosis not present

## 2022-10-27 DIAGNOSIS — J3089 Other allergic rhinitis: Secondary | ICD-10-CM | POA: Diagnosis not present

## 2022-10-27 DIAGNOSIS — J3081 Allergic rhinitis due to animal (cat) (dog) hair and dander: Secondary | ICD-10-CM | POA: Diagnosis not present

## 2022-10-27 DIAGNOSIS — J301 Allergic rhinitis due to pollen: Secondary | ICD-10-CM | POA: Diagnosis not present

## 2022-11-05 DIAGNOSIS — J3081 Allergic rhinitis due to animal (cat) (dog) hair and dander: Secondary | ICD-10-CM | POA: Diagnosis not present

## 2022-11-05 DIAGNOSIS — F4320 Adjustment disorder, unspecified: Secondary | ICD-10-CM | POA: Diagnosis not present

## 2022-11-05 DIAGNOSIS — J301 Allergic rhinitis due to pollen: Secondary | ICD-10-CM | POA: Diagnosis not present

## 2022-11-05 DIAGNOSIS — J3089 Other allergic rhinitis: Secondary | ICD-10-CM | POA: Diagnosis not present

## 2022-11-12 DIAGNOSIS — J3081 Allergic rhinitis due to animal (cat) (dog) hair and dander: Secondary | ICD-10-CM | POA: Diagnosis not present

## 2022-11-12 DIAGNOSIS — J3089 Other allergic rhinitis: Secondary | ICD-10-CM | POA: Diagnosis not present

## 2022-11-12 DIAGNOSIS — J301 Allergic rhinitis due to pollen: Secondary | ICD-10-CM | POA: Diagnosis not present

## 2022-11-19 DIAGNOSIS — J301 Allergic rhinitis due to pollen: Secondary | ICD-10-CM | POA: Diagnosis not present

## 2022-11-19 DIAGNOSIS — J3089 Other allergic rhinitis: Secondary | ICD-10-CM | POA: Diagnosis not present

## 2022-11-19 DIAGNOSIS — J3081 Allergic rhinitis due to animal (cat) (dog) hair and dander: Secondary | ICD-10-CM | POA: Diagnosis not present

## 2022-11-26 DIAGNOSIS — F4320 Adjustment disorder, unspecified: Secondary | ICD-10-CM | POA: Diagnosis not present

## 2022-11-26 DIAGNOSIS — J3081 Allergic rhinitis due to animal (cat) (dog) hair and dander: Secondary | ICD-10-CM | POA: Diagnosis not present

## 2022-11-26 DIAGNOSIS — J3089 Other allergic rhinitis: Secondary | ICD-10-CM | POA: Diagnosis not present

## 2022-11-26 DIAGNOSIS — J301 Allergic rhinitis due to pollen: Secondary | ICD-10-CM | POA: Diagnosis not present

## 2022-12-03 DIAGNOSIS — J3089 Other allergic rhinitis: Secondary | ICD-10-CM | POA: Diagnosis not present

## 2022-12-03 DIAGNOSIS — J301 Allergic rhinitis due to pollen: Secondary | ICD-10-CM | POA: Diagnosis not present

## 2022-12-10 DIAGNOSIS — J3081 Allergic rhinitis due to animal (cat) (dog) hair and dander: Secondary | ICD-10-CM | POA: Diagnosis not present

## 2022-12-10 DIAGNOSIS — J301 Allergic rhinitis due to pollen: Secondary | ICD-10-CM | POA: Diagnosis not present

## 2022-12-10 DIAGNOSIS — J3089 Other allergic rhinitis: Secondary | ICD-10-CM | POA: Diagnosis not present

## 2022-12-12 DIAGNOSIS — R051 Acute cough: Secondary | ICD-10-CM | POA: Diagnosis not present

## 2022-12-12 DIAGNOSIS — U071 COVID-19: Secondary | ICD-10-CM | POA: Diagnosis not present

## 2022-12-23 DIAGNOSIS — J0110 Acute frontal sinusitis, unspecified: Secondary | ICD-10-CM | POA: Diagnosis not present

## 2022-12-24 DIAGNOSIS — J3089 Other allergic rhinitis: Secondary | ICD-10-CM | POA: Diagnosis not present

## 2022-12-24 DIAGNOSIS — J3081 Allergic rhinitis due to animal (cat) (dog) hair and dander: Secondary | ICD-10-CM | POA: Diagnosis not present

## 2022-12-24 DIAGNOSIS — J301 Allergic rhinitis due to pollen: Secondary | ICD-10-CM | POA: Diagnosis not present

## 2023-01-05 DIAGNOSIS — R972 Elevated prostate specific antigen [PSA]: Secondary | ICD-10-CM | POA: Diagnosis not present

## 2023-01-05 DIAGNOSIS — N411 Chronic prostatitis: Secondary | ICD-10-CM | POA: Diagnosis not present

## 2023-01-05 DIAGNOSIS — N410 Acute prostatitis: Secondary | ICD-10-CM | POA: Diagnosis not present

## 2023-01-05 DIAGNOSIS — N4289 Other specified disorders of prostate: Secondary | ICD-10-CM | POA: Diagnosis not present

## 2023-01-05 DIAGNOSIS — C61 Malignant neoplasm of prostate: Secondary | ICD-10-CM | POA: Diagnosis not present

## 2023-01-07 DIAGNOSIS — J3089 Other allergic rhinitis: Secondary | ICD-10-CM | POA: Diagnosis not present

## 2023-01-07 DIAGNOSIS — J301 Allergic rhinitis due to pollen: Secondary | ICD-10-CM | POA: Diagnosis not present

## 2023-01-07 DIAGNOSIS — J3081 Allergic rhinitis due to animal (cat) (dog) hair and dander: Secondary | ICD-10-CM | POA: Diagnosis not present

## 2023-01-12 DIAGNOSIS — N5201 Erectile dysfunction due to arterial insufficiency: Secondary | ICD-10-CM | POA: Diagnosis not present

## 2023-01-12 DIAGNOSIS — C61 Malignant neoplasm of prostate: Secondary | ICD-10-CM | POA: Diagnosis not present

## 2023-01-13 ENCOUNTER — Telehealth: Payer: Self-pay | Admitting: Radiation Oncology

## 2023-01-13 NOTE — Telephone Encounter (Signed)
Called patient to schedule a consultation w. Dr. Kathrynn Running. No answer, LVM for a return call.  ?

## 2023-01-14 ENCOUNTER — Telehealth: Payer: Self-pay | Admitting: Hematology

## 2023-01-14 DIAGNOSIS — J3081 Allergic rhinitis due to animal (cat) (dog) hair and dander: Secondary | ICD-10-CM | POA: Diagnosis not present

## 2023-01-14 DIAGNOSIS — J3089 Other allergic rhinitis: Secondary | ICD-10-CM | POA: Diagnosis not present

## 2023-01-14 DIAGNOSIS — J301 Allergic rhinitis due to pollen: Secondary | ICD-10-CM | POA: Diagnosis not present

## 2023-01-14 NOTE — Telephone Encounter (Signed)
Return call from vm left regarding appt.

## 2023-01-16 NOTE — Progress Notes (Signed)
GU Location of Tumor / Histology: Prostate Ca  If Prostate Cancer, Gleason Score is (3 + 4) and PSA is (6.03 07/16/2022)  Biopsies      Past/Anticipated interventions by urology, if any:   Dr. Sebastian Ache   Past/Anticipated interventions by medical oncology, if any:  NA  Weight changes, if any: No  IPSS:  11 SHIM:  13  Bowel/Bladder complaints, if any:  No  Nausea/Vomiting, if any: No  Pain issues, if any:  0/10  SAFETY ISSUES: Prior radiation? No Pacemaker/ICD? No Possible current pregnancy? Male Is the patient on methotrexate? No  Current Complaints / other details:

## 2023-01-19 ENCOUNTER — Ambulatory Visit
Admission: RE | Admit: 2023-01-19 | Discharge: 2023-01-19 | Disposition: A | Discharge status: Home / Self Care | Payer: BC Managed Care – PPO | Source: Ambulatory Visit | Attending: Radiation Oncology | Admitting: Radiation Oncology

## 2023-01-19 ENCOUNTER — Encounter: Payer: Self-pay | Admitting: Radiation Oncology

## 2023-01-19 VITALS — BP 123/79 | HR 56 | Temp 97.9°F | Resp 18 | Ht 69.0 in | Wt 164.4 lb

## 2023-01-19 DIAGNOSIS — J45909 Unspecified asthma, uncomplicated: Secondary | ICD-10-CM | POA: Insufficient documentation

## 2023-01-19 DIAGNOSIS — Z809 Family history of malignant neoplasm, unspecified: Secondary | ICD-10-CM | POA: Diagnosis not present

## 2023-01-19 DIAGNOSIS — C61 Malignant neoplasm of prostate: Secondary | ICD-10-CM | POA: Diagnosis not present

## 2023-01-19 DIAGNOSIS — Z191 Hormone sensitive malignancy status: Secondary | ICD-10-CM | POA: Diagnosis not present

## 2023-01-19 DIAGNOSIS — E7800 Pure hypercholesterolemia, unspecified: Secondary | ICD-10-CM | POA: Diagnosis not present

## 2023-01-19 DIAGNOSIS — Z7951 Long term (current) use of inhaled steroids: Secondary | ICD-10-CM | POA: Insufficient documentation

## 2023-01-19 DIAGNOSIS — Z79899 Other long term (current) drug therapy: Secondary | ICD-10-CM | POA: Insufficient documentation

## 2023-01-19 NOTE — Progress Notes (Signed)
Radiation Oncology         [phone] ________________________________  Initial Outpatient Consultation  Name: David Olsen MRN: [id number]  Date: 01/19/2023  DOB: [birth year]  EX:BMWU, Maryla Morrow, MD (Inactive)  Manny, Delbert Phenix., *   REFERRING PHYSICIAN: Loletta Parish., *  DIAGNOSIS: 62 y.o. gentleman with Stage T1c adenocarcinoma of the prostate with Gleason score of 3+4, and PSA of 6.    ICD-10-CM   1. Malignant neoplasm of prostate (HCC)  C61       HISTORY OF PRESENT ILLNESS: David Olsen is a 62 y.o. male with a diagnosis of prostate cancer. He was noted to have an elevated PSA of 6.03 by his primary care physician, Dr. Atha Starks.  Accordingly, he was referred for evaluation in urology by Dr. Berneice Heinrich on 10/16/22,  digital rectal examination performed at that time showed some left lateral induration with no frank nodules.  The patient proceeded to transrectal ultrasound with 12 biopsies of the prostate on 01/05/23.  The prostate volume measured 26 cc.  Out of 12 core biopsies, 5 were positive, all on the left side.  The maximum Gleason score was 3+4, and this was seen in all 5 positive left-sided cores.  The patient reviewed the biopsy results with his urologist and he has kindly been referred today for discussion of potential radiation treatment options.   PREVIOUS RADIATION THERAPY: No  PAST MEDICAL HISTORY:  Past Medical History:  Diagnosis Date   Asthma    Eczema    Elevated LDL cholesterol level    Hemorrhoids    Hypercholesterolemia    White coat syndrome without hypertension       PAST SURGICAL HISTORY: Past Surgical History:  Procedure Laterality Date   colonscopy  age 6 or 24   LAPAROSCOPIC APPENDECTOMY N/A 12/09/2013   Procedure: LAPAROSCOPIC INTERVAL APPENDECTOMY;  Surgeon: Avel Peace, MD;  Location: [facility];  Service: General;  Laterality: N/A;   PROSTATE BIOPSY      FAMILY HISTORY:  Family History  Problem Relation Age of Onset    Cancer Mother    Coronary artery disease Father     SOCIAL HISTORY:  Social History   Socioeconomic History   Marital status: Married    Spouse name: Not on file   Number of children: Not on file   Years of education: Not on file   Highest education level: Not on file  Occupational History   Not on file  Tobacco Use   Smoking status: Never   Smokeless tobacco: Never  Vaping Use   Vaping status: Never Used  Substance and Sexual Activity   Alcohol use: Yes    Comment: occ   Drug use: No   Sexual activity: Yes  Other Topics Concern   Not on file  Social History Narrative   Not on file   Social Determinants of Health   Financial Resource Strain: Not on file  Food Insecurity: No Food Insecurity (01/19/2023)   Hunger Vital Sign    Worried About Running Out of Food in the Last Year: Never true    Ran Out of Food in the Last Year: Never true  Transportation Needs: No Transportation Needs (01/19/2023)   PRAPARE - Administrator, Civil Service (Medical): No    Lack of Transportation (Non-Medical): No  Physical Activity: Not on file  Stress: Not on file  Social Connections: Not on file  Intimate Partner Violence: Not At Risk (01/19/2023)   Humiliation, Afraid, Rape,  and Kick questionnaire    Fear of Current or Ex-Partner: No    Emotionally Abused: No    Physically Abused: No    Sexually Abused: No    ALLERGIES: Grass pollen(k-o-r-t-swt vern) and Peanuts [peanut oil]  MEDICATIONS:  Current Outpatient Medications  Medication Sig Dispense Refill   albuterol (PROVENTIL) (2.5 MG/3ML) 0.083% nebulizer solution SMARTSIG:1 Vial(s) Via Nebulizer Every 4-6 Hours PRN     EPINEPHrine 0.3 mg/0.3 mL IJ SOAJ injection SMARTSIG:Injection IM As Directed     montelukast (SINGULAIR) 10 MG tablet Take 10 mg by mouth at bedtime.     omeprazole (PRILOSEC) 40 MG capsule Take 40 mg by mouth daily.     albuterol (PROVENTIL HFA;VENTOLIN HFA) 108 (90 BASE) MCG/ACT inhaler Inhale 1  puff into the lungs every 6 (six) hours as needed for wheezing or shortness of breath.     fluticasone (FLONASE) 50 MCG/ACT nasal spray Place 1 spray into both nostrils every evening.      fluticasone furoate-vilanterol (BREO ELLIPTA) 200-25 MCG/ACT AEPB as directed.     hydrOXYzine (ATARAX) 50 MG tablet Take 1 tablet by mouth every 8 (eight) hours as needed.     levocetirizine (XYZAL) 5 MG tablet Take 1 tablet by mouth at bedtime.     losartan (COZAAR) 25 MG tablet Take 25 mg by mouth daily.     Multiple Vitamins-Minerals (MULTIVITAMIN WITH MINERALS) tablet Take 1 tablet by mouth daily.     Probiotic Product (PROBIOTIC DAILY PO) Take 1 tablet by mouth daily.     No current facility-administered medications for this encounter.    REVIEW OF SYSTEMS:  On review of systems, the patient reports that he is doing well overall. He denies any chest pain, shortness of breath, cough, fevers, chills, night sweats, unintended weight changes. He denies any bowel disturbances, and denies abdominal pain, nausea or vomiting. He denies any new musculoskeletal or joint aches or pains. His IPSS was 11, indicating moderate urinary symptoms. His SHIM was 13, indicating he mild-moderate erectile dysfunction. A complete review of systems is obtained and is otherwise negative.    PHYSICAL EXAM:  Wt Readings from Last 3 Encounters:  01/19/23 164 lb 6.4 oz (74.6 kg)  08/21/21 161 lb 6.4 oz (73.2 kg)  03/19/19 155 lb (70.3 kg)   Temp Readings from Last 3 Encounters:  01/19/23 97.9 F (36.6 C)  03/19/19 98 F (36.7 C) (Oral)  12/09/13 97.9 F (36.6 C) (Oral)   BP Readings from Last 3 Encounters:  01/19/23 123/79  08/21/21 122/86  03/19/19 138/74   Pulse Readings from Last 3 Encounters:  01/19/23 (!) 56  08/21/21 (!) 57  03/19/19 68   Pain Assessment Pain Score: 0-No pain/10  In general this is a well appearing male in no acute distress. He's alert and oriented x4 and appropriate throughout the  examination. Cardiopulmonary assessment is negative for acute distress, and he exhibits normal effort.     KPS = 100  100 - Normal; no complaints; no evidence of disease. 90   - Able to carry on normal activity; minor signs or symptoms of disease. 80   - Normal activity with effort; some signs or symptoms of disease. 16   - Cares for self; unable to carry on normal activity or to do active work. 60   - Requires occasional assistance, but is able to care for most of his personal needs. 50   - Requires considerable assistance and frequent medical care. 40   - Disabled; requires  special care and assistance. 30   - Severely disabled; hospital admission is indicated although death not imminent. 20   - Very sick; hospital admission necessary; active supportive treatment necessary. 10   - Moribund; fatal processes progressing rapidly. 0     - Dead  Karnofsky DA, Abelmann WH, Craver LS and Burchenal [facility] [phone]) The use of the nitrogen mustards in the palliative treatment of carcinoma: with particular reference to bronchogenic carcinoma Cancer 1 634-56  LABORATORY DATA:  Lab Results  Component Value Date   WBC 5.4 12/01/2013   HGB 14.2 12/01/2013   HCT 43.5 12/01/2013   MCV 97.3 12/01/2013   PLT 230 12/01/2013   Lab Results  Component Value Date   NA 141 12/01/2013   K 4.9 12/01/2013   CL 102 12/01/2013   CO2 28 12/01/2013   Lab Results  Component Value Date   ALT 18 12/01/2013   AST 24 12/01/2013   ALKPHOS 65 12/01/2013   BILITOT 0.3 12/01/2013     RADIOGRAPHY: No results found.    IMPRESSION/PLAN: 1. 62 y.o. gentleman with Stage T1c adenocarcinoma of the prostate with Gleason Score of 3+4, and PSA of 6. We discussed the patient's workup and outlined the nature of prostate cancer in this setting. The patient's T stage, Gleason's score, and PSA put him into the favorable intermediate risk group. Accordingly, he is eligible for a variety of potential treatment options including  brachytherapy, 5.5 weeks of external radiation, or prostatectomy. We discussed the available radiation techniques, and focused on the details and logistics of delivery. We discussed and outlined the risks, benefits, short and long-term effects associated with radiotherapy and compared and contrasted these with prostatectomy. We discussed the role of SpaceOAR gel in reducing the rectal toxicity associated with radiotherapy.  He appears to have a good understanding of his disease and our treatment recommendations which are of curative intent.  He was encouraged to ask questions that were answered to his stated satisfaction.  At the conclusion of our conversation, the patient would like to continue to think about his options. He has a couple of contacts who have undergone different treatment modalities for their prostate cancer and would like to review with their experiences with them. He was provided with the contact information for our nurse navigator, Marisue Ivan, and knows to inform us once he has made a decision.  We personally spent 60 minutes in this encounter including chart review, reviewing radiological studies, meeting face-to-face with the patient, entering orders and completing documentation.     Joyice Faster, PA-C    Margaretmary Dys, MD  [facility]  Radiation Oncology Direct Dial: [phone]  Fax: [phone] [email or web address]  Skype  LinkedIn   This document serves as a record of services personally performed by Margaretmary Dys, MD and Joyice Faster, PA-C. It was created on their behalf by Mickie Bail, a trained medical scribe. The creation of this record is based on the scribe's personal observations and the provider's statements to them. This document has been checked and approved by the attending provider.

## 2023-01-19 NOTE — Progress Notes (Signed)
Introduced myself to the patient, and his wife, as the prostate nurse navigator.  No barriers to care identified at this time.  He is here to discuss his radiation treatment options.  I gave him my business card and asked him to call me with questions or concerns.  Verbalized understanding.

## 2023-01-21 DIAGNOSIS — J301 Allergic rhinitis due to pollen: Secondary | ICD-10-CM | POA: Diagnosis not present

## 2023-01-21 DIAGNOSIS — J3089 Other allergic rhinitis: Secondary | ICD-10-CM | POA: Diagnosis not present

## 2023-01-22 DIAGNOSIS — G4733 Obstructive sleep apnea (adult) (pediatric): Secondary | ICD-10-CM | POA: Diagnosis not present

## 2023-01-22 DIAGNOSIS — J4530 Mild persistent asthma, uncomplicated: Secondary | ICD-10-CM | POA: Diagnosis not present

## 2023-01-22 DIAGNOSIS — E7800 Pure hypercholesterolemia, unspecified: Secondary | ICD-10-CM | POA: Diagnosis not present

## 2023-01-22 DIAGNOSIS — I10 Essential (primary) hypertension: Secondary | ICD-10-CM | POA: Diagnosis not present

## 2023-01-28 DIAGNOSIS — J3089 Other allergic rhinitis: Secondary | ICD-10-CM | POA: Diagnosis not present

## 2023-01-28 DIAGNOSIS — J301 Allergic rhinitis due to pollen: Secondary | ICD-10-CM | POA: Diagnosis not present

## 2023-01-29 DIAGNOSIS — J3089 Other allergic rhinitis: Secondary | ICD-10-CM | POA: Diagnosis not present

## 2023-02-02 NOTE — Progress Notes (Signed)
Patient was a rad onc consult on 10/21 for his stage T1c adenocarcinoma of the prostate with Gleason score of 3+4, and PSA of 6.   RN spoke with patient to follow up regarding additional questions and/or treatment decision. Patient is wanting to proceed with active surveillance at this time and plans to get back in with urology.  RN will notify MD's of treatment decision as well.  No additional needs at this time.

## 2023-02-07 DIAGNOSIS — S5001XA Contusion of right elbow, initial encounter: Secondary | ICD-10-CM | POA: Diagnosis not present

## 2023-02-07 DIAGNOSIS — S60222A Contusion of left hand, initial encounter: Secondary | ICD-10-CM | POA: Diagnosis not present

## 2023-02-07 DIAGNOSIS — T148XXA Other injury of unspecified body region, initial encounter: Secondary | ICD-10-CM | POA: Diagnosis not present

## 2023-02-11 DIAGNOSIS — J301 Allergic rhinitis due to pollen: Secondary | ICD-10-CM | POA: Diagnosis not present

## 2023-02-11 DIAGNOSIS — J3089 Other allergic rhinitis: Secondary | ICD-10-CM | POA: Diagnosis not present

## 2023-02-11 DIAGNOSIS — J3081 Allergic rhinitis due to animal (cat) (dog) hair and dander: Secondary | ICD-10-CM | POA: Diagnosis not present

## 2023-02-18 DIAGNOSIS — J301 Allergic rhinitis due to pollen: Secondary | ICD-10-CM | POA: Diagnosis not present

## 2023-02-18 DIAGNOSIS — J3081 Allergic rhinitis due to animal (cat) (dog) hair and dander: Secondary | ICD-10-CM | POA: Diagnosis not present

## 2023-02-18 DIAGNOSIS — J3089 Other allergic rhinitis: Secondary | ICD-10-CM | POA: Diagnosis not present

## 2023-02-24 DIAGNOSIS — J3089 Other allergic rhinitis: Secondary | ICD-10-CM | POA: Diagnosis not present

## 2023-02-24 DIAGNOSIS — J3081 Allergic rhinitis due to animal (cat) (dog) hair and dander: Secondary | ICD-10-CM | POA: Diagnosis not present

## 2023-02-24 DIAGNOSIS — J301 Allergic rhinitis due to pollen: Secondary | ICD-10-CM | POA: Diagnosis not present

## 2023-03-04 DIAGNOSIS — R21 Rash and other nonspecific skin eruption: Secondary | ICD-10-CM | POA: Diagnosis not present

## 2023-03-04 DIAGNOSIS — J301 Allergic rhinitis due to pollen: Secondary | ICD-10-CM | POA: Diagnosis not present

## 2023-03-04 DIAGNOSIS — J3081 Allergic rhinitis due to animal (cat) (dog) hair and dander: Secondary | ICD-10-CM | POA: Diagnosis not present

## 2023-03-04 DIAGNOSIS — J4530 Mild persistent asthma, uncomplicated: Secondary | ICD-10-CM | POA: Diagnosis not present

## 2023-03-04 DIAGNOSIS — J3089 Other allergic rhinitis: Secondary | ICD-10-CM | POA: Diagnosis not present

## 2023-03-20 DIAGNOSIS — J3089 Other allergic rhinitis: Secondary | ICD-10-CM | POA: Diagnosis not present

## 2023-03-20 DIAGNOSIS — J3081 Allergic rhinitis due to animal (cat) (dog) hair and dander: Secondary | ICD-10-CM | POA: Diagnosis not present

## 2023-03-20 DIAGNOSIS — J301 Allergic rhinitis due to pollen: Secondary | ICD-10-CM | POA: Diagnosis not present

## 2023-03-26 DIAGNOSIS — J3089 Other allergic rhinitis: Secondary | ICD-10-CM | POA: Diagnosis not present

## 2023-03-26 DIAGNOSIS — J301 Allergic rhinitis due to pollen: Secondary | ICD-10-CM | POA: Diagnosis not present

## 2023-03-26 DIAGNOSIS — J3081 Allergic rhinitis due to animal (cat) (dog) hair and dander: Secondary | ICD-10-CM | POA: Diagnosis not present

## 2023-04-03 DIAGNOSIS — J301 Allergic rhinitis due to pollen: Secondary | ICD-10-CM | POA: Diagnosis not present

## 2023-04-03 DIAGNOSIS — J3081 Allergic rhinitis due to animal (cat) (dog) hair and dander: Secondary | ICD-10-CM | POA: Diagnosis not present

## 2023-04-03 DIAGNOSIS — J3089 Other allergic rhinitis: Secondary | ICD-10-CM | POA: Diagnosis not present

## 2023-04-04 DIAGNOSIS — L209 Atopic dermatitis, unspecified: Secondary | ICD-10-CM | POA: Diagnosis not present

## 2023-04-08 DIAGNOSIS — J3089 Other allergic rhinitis: Secondary | ICD-10-CM | POA: Diagnosis not present

## 2023-04-08 DIAGNOSIS — J3081 Allergic rhinitis due to animal (cat) (dog) hair and dander: Secondary | ICD-10-CM | POA: Diagnosis not present

## 2023-04-08 DIAGNOSIS — J301 Allergic rhinitis due to pollen: Secondary | ICD-10-CM | POA: Diagnosis not present

## 2023-04-14 DIAGNOSIS — C61 Malignant neoplasm of prostate: Secondary | ICD-10-CM | POA: Diagnosis not present

## 2023-04-14 DIAGNOSIS — N5201 Erectile dysfunction due to arterial insufficiency: Secondary | ICD-10-CM | POA: Diagnosis not present

## 2023-04-15 DIAGNOSIS — J301 Allergic rhinitis due to pollen: Secondary | ICD-10-CM | POA: Diagnosis not present

## 2023-04-15 DIAGNOSIS — J3081 Allergic rhinitis due to animal (cat) (dog) hair and dander: Secondary | ICD-10-CM | POA: Diagnosis not present

## 2023-04-15 DIAGNOSIS — J3089 Other allergic rhinitis: Secondary | ICD-10-CM | POA: Diagnosis not present

## 2023-04-16 NOTE — Progress Notes (Signed)
Patient was a rad onc consult on 10/21 for his stage T1c adenocarcinoma of the prostate with Gleason score of 3+4, and PSA of 6.   Patient previously wanted to proceed with active surveillance.  Patient saw Dr. Berneice Heinrich at [facility] on 1/14 and would now like to proceed with 5.5 weeks of daily radiation.   RN spoke with patient provided education on next steps.   MD's notified.  Plan of care in progress.

## 2023-04-29 DIAGNOSIS — J301 Allergic rhinitis due to pollen: Secondary | ICD-10-CM | POA: Diagnosis not present

## 2023-04-29 DIAGNOSIS — J3081 Allergic rhinitis due to animal (cat) (dog) hair and dander: Secondary | ICD-10-CM | POA: Diagnosis not present

## 2023-04-29 DIAGNOSIS — J3089 Other allergic rhinitis: Secondary | ICD-10-CM | POA: Diagnosis not present

## 2023-05-06 DIAGNOSIS — J3089 Other allergic rhinitis: Secondary | ICD-10-CM | POA: Diagnosis not present

## 2023-05-06 DIAGNOSIS — J301 Allergic rhinitis due to pollen: Secondary | ICD-10-CM | POA: Diagnosis not present

## 2023-05-06 DIAGNOSIS — J3081 Allergic rhinitis due to animal (cat) (dog) hair and dander: Secondary | ICD-10-CM | POA: Diagnosis not present

## 2023-05-13 ENCOUNTER — Telehealth: Payer: Self-pay | Admitting: Radiation Oncology

## 2023-05-13 DIAGNOSIS — J3081 Allergic rhinitis due to animal (cat) (dog) hair and dander: Secondary | ICD-10-CM | POA: Diagnosis not present

## 2023-05-13 DIAGNOSIS — J3089 Other allergic rhinitis: Secondary | ICD-10-CM | POA: Diagnosis not present

## 2023-05-13 DIAGNOSIS — J301 Allergic rhinitis due to pollen: Secondary | ICD-10-CM | POA: Diagnosis not present

## 2023-05-13 NOTE — Telephone Encounter (Signed)
Pt called asking to f/u after recent conversation with Prostate navigator Marisue Ivan about starting XRT tx. Pt was advised I would f/u with her and c/b once we are sure we have everything we need to proceed.

## 2023-05-28 ENCOUNTER — Other Ambulatory Visit: Payer: Self-pay | Admitting: Urology

## 2023-06-01 NOTE — Progress Notes (Signed)
 RN left message for call back to assess any additional questions or barriers prior to upcoming radiation treatment.

## 2023-06-03 DIAGNOSIS — J301 Allergic rhinitis due to pollen: Secondary | ICD-10-CM | POA: Diagnosis not present

## 2023-06-03 DIAGNOSIS — J3089 Other allergic rhinitis: Secondary | ICD-10-CM | POA: Diagnosis not present

## 2023-06-04 NOTE — Progress Notes (Signed)
 RN spoke with patient and provided education on next steps with fiducial's, spaceOAR and CT Simulation.   All questions answered.  No additional needs at this time.

## 2023-06-10 DIAGNOSIS — J301 Allergic rhinitis due to pollen: Secondary | ICD-10-CM | POA: Diagnosis not present

## 2023-06-10 DIAGNOSIS — J3081 Allergic rhinitis due to animal (cat) (dog) hair and dander: Secondary | ICD-10-CM | POA: Diagnosis not present

## 2023-06-10 DIAGNOSIS — J3089 Other allergic rhinitis: Secondary | ICD-10-CM | POA: Diagnosis not present

## 2023-06-17 DIAGNOSIS — J3089 Other allergic rhinitis: Secondary | ICD-10-CM | POA: Diagnosis not present

## 2023-06-17 DIAGNOSIS — J301 Allergic rhinitis due to pollen: Secondary | ICD-10-CM | POA: Diagnosis not present

## 2023-06-17 DIAGNOSIS — J3081 Allergic rhinitis due to animal (cat) (dog) hair and dander: Secondary | ICD-10-CM | POA: Diagnosis not present

## 2023-06-24 DIAGNOSIS — J3089 Other allergic rhinitis: Secondary | ICD-10-CM | POA: Diagnosis not present

## 2023-06-24 DIAGNOSIS — J301 Allergic rhinitis due to pollen: Secondary | ICD-10-CM | POA: Diagnosis not present

## 2023-06-24 DIAGNOSIS — J3081 Allergic rhinitis due to animal (cat) (dog) hair and dander: Secondary | ICD-10-CM | POA: Diagnosis not present

## 2023-06-30 DIAGNOSIS — C61 Malignant neoplasm of prostate: Secondary | ICD-10-CM | POA: Diagnosis not present

## 2023-07-01 DIAGNOSIS — J301 Allergic rhinitis due to pollen: Secondary | ICD-10-CM | POA: Diagnosis not present

## 2023-07-01 DIAGNOSIS — J3089 Other allergic rhinitis: Secondary | ICD-10-CM | POA: Diagnosis not present

## 2023-07-06 ENCOUNTER — Encounter (HOSPITAL_COMMUNITY): Payer: Self-pay | Admitting: Urology

## 2023-07-06 NOTE — Progress Notes (Signed)
 Spoke w/ via phone for pre-op interview--- [facility] Lab needs dos----  EKG and BMP per anesthesia       Lab results------ COVID test -----patient states asymptomatic no test needed Arrive at -------1000 NPO after MN NO Solid Food.  Clear liquids from MN until---0900 Pre-Surgery Ensure or G2:  Med rec completed Medications to take morning of surgery -----NONE Diabetic medication -----  GLP1 agonist last dose: GLP1 instructions:  Patient instructed no nail polish to be worn day of surgery Patient instructed to bring photo id and insurance card day of surgery Patient aware to have Driver (ride ) / caregiver    for 24 hours after surgery - Wife Isidoro Donning Patient Special Instructions -----shower with antibacterial soap. Pre-Op special Instructions -----  Patient verbalized understanding of instructions that were given at this phone interview. Patient denies chest pain, sob, fever, cough at the interview.

## 2023-07-08 DIAGNOSIS — J3089 Other allergic rhinitis: Secondary | ICD-10-CM | POA: Diagnosis not present

## 2023-07-08 DIAGNOSIS — J3081 Allergic rhinitis due to animal (cat) (dog) hair and dander: Secondary | ICD-10-CM | POA: Diagnosis not present

## 2023-07-08 DIAGNOSIS — J301 Allergic rhinitis due to pollen: Secondary | ICD-10-CM | POA: Diagnosis not present

## 2023-07-10 ENCOUNTER — Encounter (HOSPITAL_COMMUNITY): Payer: Self-pay | Admitting: Urology

## 2023-07-10 ENCOUNTER — Encounter (HOSPITAL_COMMUNITY)
Admission: RE | Disposition: A | Discharge status: Home / Self Care | Payer: Self-pay | Source: Home / Self Care | Attending: Urology

## 2023-07-10 ENCOUNTER — Ambulatory Visit (HOSPITAL_COMMUNITY)
Admission: RE | Admit: 2023-07-10 | Discharge: 2023-07-10 | Disposition: A | Discharge status: Home / Self Care | Payer: BC Managed Care – PPO | Attending: Urology | Admitting: Urology

## 2023-07-10 ENCOUNTER — Ambulatory Visit (HOSPITAL_COMMUNITY)

## 2023-07-10 ENCOUNTER — Telehealth: Payer: Self-pay | Admitting: *Deleted

## 2023-07-10 DIAGNOSIS — C61 Malignant neoplasm of prostate: Secondary | ICD-10-CM | POA: Insufficient documentation

## 2023-07-10 DIAGNOSIS — J45909 Unspecified asthma, uncomplicated: Secondary | ICD-10-CM | POA: Diagnosis not present

## 2023-07-10 DIAGNOSIS — E785 Hyperlipidemia, unspecified: Secondary | ICD-10-CM | POA: Insufficient documentation

## 2023-07-10 DIAGNOSIS — I10 Essential (primary) hypertension: Secondary | ICD-10-CM | POA: Insufficient documentation

## 2023-07-10 DIAGNOSIS — K219 Gastro-esophageal reflux disease without esophagitis: Secondary | ICD-10-CM | POA: Insufficient documentation

## 2023-07-10 DIAGNOSIS — Z79899 Other long term (current) drug therapy: Secondary | ICD-10-CM | POA: Diagnosis not present

## 2023-07-10 HISTORY — PX: SPACE OAR INSTILLATION: SHX6769

## 2023-07-10 HISTORY — PX: GOLD SEED IMPLANT: SHX6343

## 2023-07-10 HISTORY — DX: Malignant (primary) neoplasm, unspecified: C80.1

## 2023-07-10 LAB — BASIC METABOLIC PANEL WITH GFR
Anion gap: 9 (ref 5–15)
BUN: 10 mg/dL (ref 8–23)
CO2: 24 mmol/L (ref 22–32)
Calcium: 9 mg/dL (ref 8.9–10.3)
Chloride: 103 mmol/L (ref 98–111)
Creatinine, Ser: 0.93 mg/dL (ref 0.61–1.24)
GFR, Estimated: >60 mL/min (ref >60)
Glucose, Bld: 95 mg/dL (ref 70–99)
Potassium: 4.1 mmol/L (ref 3.5–5.1)
Sodium: 136 mmol/L (ref 135–145)

## 2023-07-10 SURGERY — INSERTION, GOLD SEEDS
Anesthesia: Monitor Anesthesia Care | Site: Prostate

## 2023-07-10 MED ORDER — ACETAMINOPHEN 500 MG PO TABS
ORAL_TABLET | ORAL | Status: AC
  Filled 2023-07-10: qty 2

## 2023-07-10 MED ORDER — CHLORHEXIDINE GLUCONATE 0.12 % MT SOLN
OROMUCOSAL | Status: AC
  Filled 2023-07-10: qty 15

## 2023-07-10 MED ORDER — CEFAZOLIN SODIUM-DEXTROSE 2-4 GM/100ML-% IV SOLN
INTRAVENOUS | Status: AC
  Filled 2023-07-10: qty 100

## 2023-07-10 MED ORDER — LIDOCAINE 2% (20 MG/ML) 5 ML SYRINGE
INTRAMUSCULAR | Status: AC
  Filled 2023-07-10: qty 5

## 2023-07-10 MED ORDER — ONDANSETRON HCL 4 MG/2ML IJ SOLN
INTRAMUSCULAR | Status: AC
  Filled 2023-07-10: qty 2

## 2023-07-10 MED ORDER — ACETAMINOPHEN 500 MG PO TABS
1000.0000 mg | ORAL_TABLET | Freq: Once | ORAL | Status: AC
  Administered 2023-07-10: 1000 mg via ORAL

## 2023-07-10 MED ORDER — OXYCODONE HCL 5 MG PO TABS: 5.0000 mg | ORAL_TABLET | Freq: Once | ORAL | Status: DC | PRN

## 2023-07-10 MED ORDER — MIDAZOLAM HCL 2 MG/2ML IJ SOLN
INTRAMUSCULAR | Status: AC
  Filled 2023-07-10: qty 2

## 2023-07-10 MED ORDER — ACETAMINOPHEN 10 MG/ML IV SOLN: 1000.0000 mg | Freq: Once | INTRAVENOUS | Status: DC | PRN

## 2023-07-10 MED ORDER — DEXAMETHASONE SODIUM PHOSPHATE 10 MG/ML IJ SOLN
INTRAMUSCULAR | Status: AC
  Filled 2023-07-10: qty 1

## 2023-07-10 MED ORDER — SODIUM CHLORIDE (PF) 0.9 % IJ SOLN
INTRAMUSCULAR | Status: AC
  Filled 2023-07-10: qty 10

## 2023-07-10 MED ORDER — CHLORHEXIDINE GLUCONATE 0.12 % MT SOLN
15.0000 mL | Freq: Once | OROMUCOSAL | Status: AC
  Administered 2023-07-10: 15 mL via OROMUCOSAL

## 2023-07-10 MED ORDER — ONDANSETRON HCL 4 MG/2ML IJ SOLN
INTRAMUSCULAR | Status: DC | PRN
  Administered 2023-07-10: 4 mg via INTRAVENOUS

## 2023-07-10 MED ORDER — LIDOCAINE HCL (PF) 2 % IJ SOLN
INTRAMUSCULAR | Status: DC | PRN
  Administered 2023-07-10: 10 mL

## 2023-07-10 MED ORDER — DEXAMETHASONE SODIUM PHOSPHATE 10 MG/ML IJ SOLN
INTRAMUSCULAR | Status: DC | PRN
  Administered 2023-07-10: 10 mg via INTRAVENOUS

## 2023-07-10 MED ORDER — LIDOCAINE HCL 2 % IJ SOLN
INTRAMUSCULAR | Status: AC
  Filled 2023-07-10: qty 20

## 2023-07-10 MED ORDER — MIDAZOLAM HCL 2 MG/2ML IJ SOLN
INTRAMUSCULAR | Status: DC | PRN
  Administered 2023-07-10: 2 mg via INTRAVENOUS

## 2023-07-10 MED ORDER — SODIUM CHLORIDE (PF) 0.9 % IJ SOLN
INTRAMUSCULAR | Status: DC | PRN
  Administered 2023-07-10: 10 mL

## 2023-07-10 MED ORDER — OXYCODONE HCL 5 MG/5ML PO SOLN: 5.0000 mg | Freq: Once | ORAL | Status: DC | PRN

## 2023-07-10 MED ORDER — LIDOCAINE 2% (20 MG/ML) 5 ML SYRINGE
INTRAMUSCULAR | Status: DC | PRN
  Administered 2023-07-10: 60 mg via INTRAVENOUS

## 2023-07-10 MED ORDER — DROPERIDOL 2.5 MG/ML IJ SOLN: 0.6250 mg | Freq: Once | INTRAMUSCULAR | Status: DC | PRN

## 2023-07-10 MED ORDER — PROPOFOL 10 MG/ML IV BOLUS
INTRAVENOUS | Status: DC | PRN
Start: 2023-07-10 — End: 2023-07-10
  Administered 2023-07-10: 50 mg via INTRAVENOUS
  Administered 2023-07-10: 60 ug/kg/min via INTRAVENOUS

## 2023-07-10 MED ORDER — CEFAZOLIN SODIUM-DEXTROSE 2-4 GM/100ML-% IV SOLN: 2.0000 g | INTRAVENOUS | Status: DC

## 2023-07-10 MED ORDER — ORAL CARE MOUTH RINSE: 15.0000 mL | Freq: Once | OROMUCOSAL | Status: AC

## 2023-07-10 MED ORDER — FENTANYL CITRATE (PF) 100 MCG/2ML IJ SOLN: 25.0000 ug | INTRAMUSCULAR | Status: DC | PRN

## 2023-07-10 MED ORDER — LACTATED RINGERS IV SOLN: INTRAVENOUS | Status: DC

## 2023-07-10 SURGICAL SUPPLY — 23 items
BLADE CLIPPER SENSICLIP SURGIC (BLADE) ×2 IMPLANT
CNTNR URN SCR LID CUP LEK RST (MISCELLANEOUS) ×2 IMPLANT
COVER BACK TABLE 60X90IN (DRAPES) ×2 IMPLANT
DRSG TEGADERM 4X4.75 (GAUZE/BANDAGES/DRESSINGS) ×2 IMPLANT
DRSG TEGADERM 8X12 (GAUZE/BANDAGES/DRESSINGS) ×2 IMPLANT
GAUZE SPONGE 4X4 12PLY STRL (GAUZE/BANDAGES/DRESSINGS) ×2 IMPLANT
GLOVE BIO SURGEON STRL SZ7.5 (GLOVE) ×2 IMPLANT
GLOVE SURG ORTHO 8.5 STRL (GLOVE) ×2 IMPLANT
IMPL SPACEOAR SYSTEM 10ML (Spacer) IMPLANT
IMPLANT SPACEOAR SYSTEM 10ML (Spacer) ×1 IMPLANT
MARKER GOLD PRELOAD 1.2X3 (Urological Implant) ×2 IMPLANT
MARKER SKIN DUAL TIP RULER LAB (MISCELLANEOUS) ×2 IMPLANT
NDL SPNL 22GX3.5 QUINCKE BK (NEEDLE) IMPLANT
NEEDLE SPNL 22GX3.5 QUINCKE BK (NEEDLE) ×1 IMPLANT
SEED GOLD PRELOAD 1.2X3 (Urological Implant) ×1 IMPLANT
SHEATH ULTRASOUND LF (SHEATH) IMPLANT
SHEATH ULTRASOUND LTX NONSTRL (SHEATH) IMPLANT
SLEEVE SCD COMPRESS KNEE MED (STOCKING) ×2 IMPLANT
SURGILUBE 2OZ TUBE FLIPTOP (MISCELLANEOUS) ×2 IMPLANT
SYR 10ML LL (SYRINGE) IMPLANT
SYR CONTROL 10ML LL (SYRINGE) ×2 IMPLANT
TOWEL OR 17X24 6PK STRL BLUE (TOWEL DISPOSABLE) ×2 IMPLANT
UNDERPAD 30X36 HEAVY ABSORB (UNDERPADS AND DIAPERS) ×2 IMPLANT

## 2023-07-10 NOTE — Telephone Encounter (Signed)
 CALLED PATIENT TO REMIND OF SIM APPT. FOR 07-14-23 - ARRIVAL TIME- 9:45 AM @ [facility] , INFORMED PATIENT TO ARRIVE WITH A FULL BLADDER, SPOKE WITH PATIENT AND HE IS AWARE OF THIS APPT. AND THE INSTRUCTIONS

## 2023-07-10 NOTE — Interval H&P Note (Signed)
 History and Physical Interval Note:  07/10/2023 10:44 AM  David Olsen  has presented today for surgery, with the diagnosis of PROSTATE CANCER.  The various methods of treatment have been discussed with the patient and family. After consideration of risks, benefits and other options for treatment, the patient has consented to  Procedure(s) with comments: INSERTION, GOLD SEEDS (N/A) - 30 MINUTES NEEDED FOR CASE INJECTION, HYDROGEL SPACER (N/A) as a surgical intervention.  The patient's history has been reviewed, patient examined, no change in status, stable for surgery.  I have reviewed the patient's chart and labs.  Questions were answered to the patient's satisfaction.     Ray Church, III

## 2023-07-10 NOTE — Discharge Instructions (Addendum)
 DO NOT TAKE TYLENOL UNTIL AFTER 4:45 PM TODAY.   Diet Resume your usual diet when you return home. To keep your bowels moving easily and softly, drink prune, apple and cranberry juice at room temperature. You may also take a stool softener, such as Colace, which is available without prescription at local pharmacies. Daily activities No driving or heavy lifting for at least two days after the implant. No bike riding, horseback riding or riding lawn mowers for the first month after the implant. Any strenuous physical activity should be approved by your doctor before you resume it. Sexual relations You may resume sexual relations two weeks after the procedure. Your semen may be dark brown or black; this is normal and is related bleeding that may have occurred during the implant. Postoperative swelling Expect swelling and bruising of the scrotum and perineum (the area between the scrotum and anus). Both the swelling and the bruising should resolve in l or 2 weeks. Ice packs and over- the-counter medications such as Tylenol, Advil or Aleve may lessen your discomfort. Postoperative urination Most men experience burning on urination and/or urinary frequency. If this becomes bothersome, contact your Urologist.  Medication can be prescribed to relieve these problems.  It is normal to have some blood in your urine for a few days after the implant. Special instructions related to the seeds The seeds are silver in color and are about as large as a grain of rice. If you pass a seed, do not handle it with your fingers. Use a spoon to place it in an envelope or jar in place this in base occluded area such as the garage or basement for return to the radiation clinic at your convenience.  Contact your doctor for Temperature greater than 101 F Increasing pain Inability to urinate    Post Anesthesia Home Care Instructions  Activity: Get plenty of rest for the remainder of the day. A responsible adult should  stay with you for 24 hours following the procedure.  For the next 24 hours, DO NOT: -Drive a car -Advertising copywriter -Drink alcoholic beverages -Take any medication unless instructed by your physician -Make any legal decisions or sign important papers.  Meals: Start with liquid foods such as gelatin or soup. Progress to regular foods as tolerated. Avoid greasy, spicy, heavy foods. If nausea and/or vomiting occur, drink only clear liquids until the nausea and/or vomiting subsides. Call your physician if vomiting continues.  Special Instructions/Symptoms: Your throat may feel dry or sore from the anesthesia or the breathing tube placed in your throat during surgery. If this causes discomfort, gargle with warm salt water. The discomfort should disappear within 24 hours.    Call your surgeon if you experience:   1.  Fever over 101.0. 2.  Inability to urinate. 3.  Nausea and/or vomiting. 4.  Extreme swelling or bruising at the surgical site. 5.  Continued bleeding from the incision. 6.  Increased pain, redness or drainage from the incision. 7.  Problems related to your pain medication. 8. Any change in color, movement and/or sensation 9. Any problems and/or concerns

## 2023-07-10 NOTE — Progress Notes (Signed)
 Pt returned call can come in by 0930 today for surgery

## 2023-07-10 NOTE — Anesthesia Preprocedure Evaluation (Addendum)
 Anesthesia Evaluation  Patient identified by MRN, date of birth, ID band Patient awake    Reviewed: Allergy & Precautions, H&P , NPO status , Patient's Chart, lab work & pertinent test results  Airway Mallampati: II  TM Distance: >3 FB Neck ROM: Full    Dental no notable dental hx. (+) Dental Advisory Given   Pulmonary asthma    Pulmonary exam normal breath sounds clear to auscultation       Cardiovascular hypertension, Normal cardiovascular exam Rhythm:Regular Rate:Normal  HLD   Neuro/Psych negative neurological ROS  negative psych ROS   GI/Hepatic Neg liver ROS,GERD  Medicated,,  Endo/Other  negative endocrine ROS    Renal/GU negative Renal ROS   Prostate cancer    Musculoskeletal negative musculoskeletal ROS (+)    Abdominal   Peds negative pediatric ROS (+)  Hematology negative hematology ROS (+)   Anesthesia Other Findings   Reproductive/Obstetrics negative OB ROS                             Anesthesia Physical Anesthesia Plan  ASA: 2  Anesthesia Plan: MAC   Post-op Pain Management:    Induction: Intravenous  PONV Risk Score and Plan: 1 and Propofol infusion and Treatment may vary due to age or medical condition  Airway Management Planned: Natural Airway  Additional Equipment:   Intra-op Plan:   Post-operative Plan:   Informed Consent: I have reviewed the patients History and Physical, chart, labs and discussed the procedure including the risks, benefits and alternatives for the proposed anesthesia with the patient or authorized representative who has indicated his/her understanding and acceptance.     Dental advisory given  Plan Discussed with: CRNA  Anesthesia Plan Comments:        Anesthesia Quick Evaluation

## 2023-07-10 NOTE — H&P (Signed)
 H&P  Chief Complaint: prostate cancer  History of Present Illness: 63 YO M with prostate cancer presents for marker placement and spaceoar  Past Medical History:  Diagnosis Date   Asthma    Cancer (HCC)    Eczema    Elevated LDL cholesterol level    Hemorrhoids    Hypercholesterolemia    White coat syndrome without hypertension    Past Surgical History:  Procedure Laterality Date   colonscopy  age 89 or 9   LAPAROSCOPIC APPENDECTOMY N/A 12/09/2013   Procedure: LAPAROSCOPIC INTERVAL APPENDECTOMY;  Surgeon: Avel Peace, MD;  Location: [facility];  Service: General;  Laterality: N/A;   PROSTATE BIOPSY      Home Medications:  No medications prior to admission.   Allergies:  Allergies  Allergen Reactions   Grass Pollen(K-O-R-T-Swt Vern) Shortness Of Breath   Peanuts [Peanut Oil] Rash    Family History  Problem Relation Age of Onset   Cancer Mother    Coronary artery disease Father    Social History:  reports that he has never smoked. He has never used smokeless tobacco. He reports current alcohol use. He reports that he does not use drugs.  ROS: A complete review of systems was performed.  All systems are negative except for pertinent findings as noted. ROS   Physical Exam:  Vital signs in last 24 hours:   General:  Alert and oriented, No acute distress HEENT: Normocephalic, atraumatic Neck: No JVD or lymphadenopathy Cardiovascular: Regular rate and rhythm Lungs: Regular rate and effort Abdomen: Soft, nontender, nondistended, no abdominal masses Back: No CVA tenderness Extremities: No edema Neurologic: Grossly intact  Laboratory Data:  No results found for this or any previous visit (from the past 24 hours). No results found for this or any previous visit (from the past 240 hours). Creatinine: No results for input(s): "CREATININE" in the last 168 hours.  Impression/Assessment:  Prostate cancer   Plan:  Proceed with marker placement and  spaceoar  David Olsen, III 07/10/2023, 8:18 AM

## 2023-07-10 NOTE — Transfer of Care (Signed)
 Immediate Anesthesia Transfer of Care Note  Patient: David Olsen  Procedure(s) Performed: INSERTION, GOLD SEEDS (Prostate) INJECTION, HYDROGEL SPACER (Prostate)  Patient Location: PACU  Anesthesia Type:MAC  Level of Consciousness: awake and sedated  Airway & Oxygen Therapy: Patient Spontanous Breathing and Patient connected to face mask oxygen  Post-op Assessment: Report given to RN and Post -op Vital signs reviewed and stable  Post vital signs: Reviewed and stable  Last Vitals:  Vitals Value Taken Time  BP 135/83 07/10/23 1150  Temp    Pulse 53 07/10/23 1151  Resp 13 07/10/23 1151  SpO2 100 % 07/10/23 1151  Vitals shown include unfiled device data.  Last Pain:  Vitals:   07/10/23 1010  TempSrc: Oral  PainSc: 0-No pain      Patients Stated Pain Goal: 6 (07/10/23 1010)  Complications: No notable events documented.

## 2023-07-10 NOTE — Anesthesia Postprocedure Evaluation (Signed)
 Anesthesia Post Note  Patient: David Olsen  Procedure(s) Performed: INSERTION, GOLD SEEDS (Prostate) INJECTION, HYDROGEL SPACER (Prostate)     Patient location during evaluation: PACU Anesthesia Type: MAC Level of consciousness: awake and alert Pain management: pain level controlled Vital Signs Assessment: post-procedure vital signs reviewed and stable Respiratory status: spontaneous breathing, nonlabored ventilation, respiratory function stable and patient connected to nasal cannula oxygen Cardiovascular status: stable and blood pressure returned to baseline Postop Assessment: no apparent nausea or vomiting Anesthetic complications: no   No notable events documented.  Last Vitals:  Vitals:   07/10/23 1150 07/10/23 1215  BP: 135/83   Pulse: (!) 58 (!) 49  Resp: 13 14  Temp: 36.5 C   SpO2: 99% 99%    Last Pain:  Vitals:   07/10/23 1150  TempSrc:   PainSc: Asleep                 Malinta Nation

## 2023-07-10 NOTE — Op Note (Signed)
 Preoperative diagnosis: Clinically localized adenocarcinoma of the prostate   Postoperative diagnosis: Clinically localized adenocarcinoma of the prostate  Procedure: 1) Placement of fiducial markers into prostate                    2) Insertion of SpaceOAR hydrogel   Surgeon: Modena Slater, M.D.  Anesthesia: MAC sedation  EBL: Minimal  Complications: None  Indication: David Olsen is a 63 y.o. gentleman with clinically localized prostate cancer. After discussing management options for treatment, he elected to proceed with radiotherapy. He presents today for the above procedures. The potential risks, complications, alternative options, and expected recovery course have been discussed in detail with the patient and he has provided informed consent to proceed.  Description of procedure: The patient was administered preoperative antibiotics, placed in the dorsal lithotomy position, and prepped and draped in the usual sterile fashion. Next, transrectal ultrasonography was utilized to visualize the prostate.  The perineum was anesthetized with quarter percent Marcaine. Three gold fiducial markers were then placed into the prostate via transperineal needles under ultrasound guidance at the right apex, right base, and left mid gland under direct ultrasound guidance. A site in the midline was then selected on the perineum for placement of an 18 g needle with saline. The needle was advanced above the rectum and below Denonvillier's fascia to the mid gland and confirmed to be in the midline on transverse imaging. One cc of saline was injected confirming appropriate expansion of this space. A total of 5 cc of saline was then injected to open the space further bilaterally. The saline syringe was then removed and the SpaceOAR hydrogel was injected with good distribution bilaterally. He tolerated the procedure well and without complications. He was given a voiding trial prior to discharge from the PACU.

## 2023-07-12 DIAGNOSIS — C61 Malignant neoplasm of prostate: Secondary | ICD-10-CM | POA: Diagnosis not present

## 2023-07-12 DIAGNOSIS — Z191 Hormone sensitive malignancy status: Secondary | ICD-10-CM | POA: Diagnosis not present

## 2023-07-12 NOTE — Progress Notes (Signed)
  Radiation Oncology         [phone] ________________________________  Name: MAHLON GABRIELLE MRN: [id number]  Date: 07/14/2023  DOB: 1960-10-11  SIMULATION AND TREATMENT PLANNING NOTE    ICD-10-CM   1. Malignant neoplasm of prostate (HCC)  C61       DIAGNOSIS:  63 y.o. gentleman with Stage T1c adenocarcinoma of the prostate with Gleason score of 3+4, and PSA of 6.  NARRATIVE:  The patient was brought to the CT Simulation planning suite.  Identity was confirmed.  All relevant records and images related to the planned course of therapy were reviewed.  The patient freely provided informed written consent to proceed with treatment after reviewing the details related to the planned course of therapy. The consent form was witnessed and verified by the simulation staff.  Then, the patient was set-up in a stable reproducible supine position for radiation therapy.  A vacuum lock pillow device was custom fabricated to position his legs in a reproducible immobilized position.  Then, supervised the performance of a urethrogram under sterile conditions to identify the prostatic apex.  CT images were obtained.  Surface markings were placed.  The CT images were loaded into the planning software.  Then the prostate target and avoidance structures including the rectum, bladder, bowel and hips were contoured.  Treatment planning then occurred.  The radiation prescription was entered and confirmed.  A total of one complex treatment devices was fabricated. I have requested : Intensity Modulated Radiotherapy (IMRT) is medically necessary for this case for the following reason:  Rectal sparing.  I have requested daily cone beam CT volumetric image gudiance to track gold fiducial posiitoning along with bladder and rectal filling, this is medically necessary to assure accurate positioning of high dose radiation.  PLAN:  The patient will receive 70 Gy in 28 fractions.  ________________________________  Trilby Fujisawa  Lorri Rota, M.D.

## 2023-07-13 ENCOUNTER — Encounter (HOSPITAL_COMMUNITY): Payer: Self-pay | Admitting: Urology

## 2023-07-14 ENCOUNTER — Ambulatory Visit
Admission: RE | Admit: 2023-07-14 | Discharge: 2023-07-14 | Disposition: A | Discharge status: Home / Self Care | Payer: BC Managed Care – PPO | Source: Ambulatory Visit | Attending: Radiation Oncology | Admitting: Radiation Oncology

## 2023-07-14 DIAGNOSIS — Z191 Hormone sensitive malignancy status: Secondary | ICD-10-CM | POA: Diagnosis not present

## 2023-07-14 DIAGNOSIS — C61 Malignant neoplasm of prostate: Secondary | ICD-10-CM | POA: Diagnosis not present

## 2023-07-14 DIAGNOSIS — Z510 Encounter for antineoplastic radiation therapy: Secondary | ICD-10-CM | POA: Diagnosis not present

## 2023-07-14 MED FILL — Cefazolin Sodium-Dextrose IV Solution 2 GM/100ML-4%: INTRAVENOUS | Qty: 100 | Status: AC

## 2023-07-15 DIAGNOSIS — J3089 Other allergic rhinitis: Secondary | ICD-10-CM | POA: Diagnosis not present

## 2023-07-15 DIAGNOSIS — J301 Allergic rhinitis due to pollen: Secondary | ICD-10-CM | POA: Diagnosis not present

## 2023-07-15 DIAGNOSIS — C61 Malignant neoplasm of prostate: Secondary | ICD-10-CM | POA: Diagnosis not present

## 2023-07-15 DIAGNOSIS — Z191 Hormone sensitive malignancy status: Secondary | ICD-10-CM | POA: Diagnosis not present

## 2023-07-15 DIAGNOSIS — Z510 Encounter for antineoplastic radiation therapy: Secondary | ICD-10-CM | POA: Diagnosis not present

## 2023-07-23 DIAGNOSIS — J3081 Allergic rhinitis due to animal (cat) (dog) hair and dander: Secondary | ICD-10-CM | POA: Diagnosis not present

## 2023-07-23 DIAGNOSIS — J3089 Other allergic rhinitis: Secondary | ICD-10-CM | POA: Diagnosis not present

## 2023-07-23 DIAGNOSIS — J301 Allergic rhinitis due to pollen: Secondary | ICD-10-CM | POA: Diagnosis not present

## 2023-07-27 ENCOUNTER — Other Ambulatory Visit: Payer: Self-pay

## 2023-07-27 ENCOUNTER — Ambulatory Visit
Admission: RE | Admit: 2023-07-27 | Discharge: 2023-07-27 | Disposition: A | Discharge status: Home / Self Care | Source: Ambulatory Visit | Attending: Radiation Oncology

## 2023-07-27 DIAGNOSIS — Z510 Encounter for antineoplastic radiation therapy: Secondary | ICD-10-CM | POA: Diagnosis not present

## 2023-07-27 DIAGNOSIS — Z191 Hormone sensitive malignancy status: Secondary | ICD-10-CM | POA: Diagnosis not present

## 2023-07-27 DIAGNOSIS — C61 Malignant neoplasm of prostate: Secondary | ICD-10-CM | POA: Diagnosis not present

## 2023-07-27 LAB — RAD ONC ARIA SESSION SUMMARY
Course Elapsed Days: 0
Plan Fractions Treated to Date: 1
Plan Prescribed Dose Per Fraction: 2.5 Gy
Plan Total Fractions Prescribed: 28
Plan Total Prescribed Dose: 70 Gy
Reference Point Dosage Given to Date: 2.5 Gy
Reference Point Session Dosage Given: 2.5 Gy
Session Number: 1

## 2023-07-28 ENCOUNTER — Ambulatory Visit
Admission: RE | Admit: 2023-07-28 | Discharge: 2023-07-28 | Discharge status: Home / Self Care | Source: Ambulatory Visit | Attending: Radiation Oncology

## 2023-07-28 ENCOUNTER — Other Ambulatory Visit: Payer: Self-pay

## 2023-07-28 DIAGNOSIS — Z510 Encounter for antineoplastic radiation therapy: Secondary | ICD-10-CM | POA: Diagnosis not present

## 2023-07-28 DIAGNOSIS — C61 Malignant neoplasm of prostate: Secondary | ICD-10-CM | POA: Diagnosis not present

## 2023-07-28 DIAGNOSIS — Z191 Hormone sensitive malignancy status: Secondary | ICD-10-CM | POA: Diagnosis not present

## 2023-07-28 LAB — RAD ONC ARIA SESSION SUMMARY
Course Elapsed Days: 1
Plan Fractions Treated to Date: 2
Plan Prescribed Dose Per Fraction: 2.5 Gy
Plan Total Fractions Prescribed: 28
Plan Total Prescribed Dose: 70 Gy
Reference Point Dosage Given to Date: 5 Gy
Reference Point Session Dosage Given: 2.5 Gy
Session Number: 2

## 2023-07-29 ENCOUNTER — Other Ambulatory Visit: Payer: Self-pay

## 2023-07-29 ENCOUNTER — Ambulatory Visit
Admission: RE | Admit: 2023-07-29 | Discharge: 2023-07-29 | Disposition: A | Discharge status: Home / Self Care | Source: Ambulatory Visit | Attending: Radiation Oncology | Admitting: Radiation Oncology

## 2023-07-29 DIAGNOSIS — C61 Malignant neoplasm of prostate: Secondary | ICD-10-CM | POA: Diagnosis not present

## 2023-07-29 DIAGNOSIS — E7800 Pure hypercholesterolemia, unspecified: Secondary | ICD-10-CM | POA: Diagnosis not present

## 2023-07-29 DIAGNOSIS — Z0000 Encounter for general adult medical examination without abnormal findings: Secondary | ICD-10-CM | POA: Diagnosis not present

## 2023-07-29 DIAGNOSIS — I10 Essential (primary) hypertension: Secondary | ICD-10-CM | POA: Diagnosis not present

## 2023-07-29 DIAGNOSIS — J3089 Other allergic rhinitis: Secondary | ICD-10-CM | POA: Diagnosis not present

## 2023-07-29 DIAGNOSIS — J3081 Allergic rhinitis due to animal (cat) (dog) hair and dander: Secondary | ICD-10-CM | POA: Diagnosis not present

## 2023-07-29 DIAGNOSIS — K219 Gastro-esophageal reflux disease without esophagitis: Secondary | ICD-10-CM | POA: Diagnosis not present

## 2023-07-29 DIAGNOSIS — Z191 Hormone sensitive malignancy status: Secondary | ICD-10-CM | POA: Diagnosis not present

## 2023-07-29 DIAGNOSIS — J301 Allergic rhinitis due to pollen: Secondary | ICD-10-CM | POA: Diagnosis not present

## 2023-07-29 DIAGNOSIS — Z510 Encounter for antineoplastic radiation therapy: Secondary | ICD-10-CM | POA: Diagnosis not present

## 2023-07-29 DIAGNOSIS — G4733 Obstructive sleep apnea (adult) (pediatric): Secondary | ICD-10-CM | POA: Diagnosis not present

## 2023-07-29 LAB — RAD ONC ARIA SESSION SUMMARY
Course Elapsed Days: 2
Plan Fractions Treated to Date: 3
Plan Prescribed Dose Per Fraction: 2.5 Gy
Plan Total Fractions Prescribed: 28
Plan Total Prescribed Dose: 70 Gy
Reference Point Dosage Given to Date: 7.5 Gy
Reference Point Session Dosage Given: 2.5 Gy
Session Number: 3

## 2023-07-30 ENCOUNTER — Ambulatory Visit: Admission: RE | Admit: 2023-07-30 | Source: Ambulatory Visit

## 2023-07-30 ENCOUNTER — Ambulatory Visit
Admission: RE | Admit: 2023-07-30 | Discharge: 2023-07-30 | Disposition: A | Discharge status: Home / Self Care | Source: Ambulatory Visit | Attending: Radiation Oncology | Admitting: Radiation Oncology

## 2023-07-30 ENCOUNTER — Other Ambulatory Visit: Payer: Self-pay

## 2023-07-30 DIAGNOSIS — C61 Malignant neoplasm of prostate: Secondary | ICD-10-CM | POA: Diagnosis not present

## 2023-07-30 DIAGNOSIS — Z510 Encounter for antineoplastic radiation therapy: Secondary | ICD-10-CM | POA: Insufficient documentation

## 2023-07-30 DIAGNOSIS — Z191 Hormone sensitive malignancy status: Secondary | ICD-10-CM | POA: Diagnosis not present

## 2023-07-30 LAB — RAD ONC ARIA SESSION SUMMARY
Course Elapsed Days: 3
Plan Fractions Treated to Date: 4
Plan Prescribed Dose Per Fraction: 2.5 Gy
Plan Total Fractions Prescribed: 28
Plan Total Prescribed Dose: 70 Gy
Reference Point Dosage Given to Date: 10 Gy
Reference Point Session Dosage Given: 2.5 Gy
Session Number: 4

## 2023-07-31 ENCOUNTER — Ambulatory Visit
Admission: RE | Admit: 2023-07-31 | Discharge: 2023-07-31 | Disposition: A | Discharge status: Home / Self Care | Source: Ambulatory Visit | Attending: Radiation Oncology

## 2023-07-31 ENCOUNTER — Other Ambulatory Visit: Payer: Self-pay

## 2023-07-31 DIAGNOSIS — Z510 Encounter for antineoplastic radiation therapy: Secondary | ICD-10-CM | POA: Diagnosis not present

## 2023-07-31 DIAGNOSIS — C61 Malignant neoplasm of prostate: Secondary | ICD-10-CM | POA: Diagnosis not present

## 2023-07-31 DIAGNOSIS — Z191 Hormone sensitive malignancy status: Secondary | ICD-10-CM | POA: Diagnosis not present

## 2023-07-31 LAB — RAD ONC ARIA SESSION SUMMARY
Course Elapsed Days: 4
Plan Fractions Treated to Date: 5
Plan Prescribed Dose Per Fraction: 2.5 Gy
Plan Total Fractions Prescribed: 28
Plan Total Prescribed Dose: 70 Gy
Reference Point Dosage Given to Date: 12.5 Gy
Reference Point Session Dosage Given: 2.5 Gy
Session Number: 5

## 2023-08-03 ENCOUNTER — Ambulatory Visit
Admission: RE | Admit: 2023-08-03 | Discharge: 2023-08-03 | Disposition: A | Discharge status: Home / Self Care | Source: Ambulatory Visit | Attending: Radiation Oncology | Admitting: Radiation Oncology

## 2023-08-03 ENCOUNTER — Other Ambulatory Visit: Payer: Self-pay

## 2023-08-03 DIAGNOSIS — C61 Malignant neoplasm of prostate: Secondary | ICD-10-CM | POA: Diagnosis not present

## 2023-08-03 DIAGNOSIS — Z191 Hormone sensitive malignancy status: Secondary | ICD-10-CM | POA: Diagnosis not present

## 2023-08-03 DIAGNOSIS — Z510 Encounter for antineoplastic radiation therapy: Secondary | ICD-10-CM | POA: Diagnosis not present

## 2023-08-03 LAB — RAD ONC ARIA SESSION SUMMARY
Course Elapsed Days: 7
Plan Fractions Treated to Date: 6
Plan Prescribed Dose Per Fraction: 2.5 Gy
Plan Total Fractions Prescribed: 28
Plan Total Prescribed Dose: 70 Gy
Reference Point Dosage Given to Date: 15 Gy
Reference Point Session Dosage Given: 2.5 Gy
Session Number: 6

## 2023-08-04 ENCOUNTER — Ambulatory Visit
Admission: RE | Admit: 2023-08-04 | Discharge: 2023-08-04 | Disposition: A | Discharge status: Home / Self Care | Source: Ambulatory Visit | Attending: Radiation Oncology

## 2023-08-04 ENCOUNTER — Other Ambulatory Visit: Payer: Self-pay

## 2023-08-04 DIAGNOSIS — Z510 Encounter for antineoplastic radiation therapy: Secondary | ICD-10-CM | POA: Diagnosis not present

## 2023-08-04 DIAGNOSIS — Z191 Hormone sensitive malignancy status: Secondary | ICD-10-CM | POA: Diagnosis not present

## 2023-08-04 DIAGNOSIS — C61 Malignant neoplasm of prostate: Secondary | ICD-10-CM | POA: Diagnosis not present

## 2023-08-04 LAB — RAD ONC ARIA SESSION SUMMARY
Course Elapsed Days: 8
Plan Fractions Treated to Date: 7
Plan Prescribed Dose Per Fraction: 2.5 Gy
Plan Total Fractions Prescribed: 28
Plan Total Prescribed Dose: 70 Gy
Reference Point Dosage Given to Date: 17.5 Gy
Reference Point Session Dosage Given: 2.5 Gy
Session Number: 7

## 2023-08-05 ENCOUNTER — Other Ambulatory Visit: Payer: Self-pay

## 2023-08-05 ENCOUNTER — Ambulatory Visit
Admission: RE | Admit: 2023-08-05 | Discharge: 2023-08-05 | Disposition: A | Discharge status: Home / Self Care | Source: Ambulatory Visit | Attending: Radiation Oncology | Admitting: Radiation Oncology

## 2023-08-05 DIAGNOSIS — J4530 Mild persistent asthma, uncomplicated: Secondary | ICD-10-CM | POA: Diagnosis not present

## 2023-08-05 DIAGNOSIS — J40 Bronchitis, not specified as acute or chronic: Secondary | ICD-10-CM | POA: Diagnosis not present

## 2023-08-05 DIAGNOSIS — Z191 Hormone sensitive malignancy status: Secondary | ICD-10-CM | POA: Diagnosis not present

## 2023-08-05 DIAGNOSIS — J3089 Other allergic rhinitis: Secondary | ICD-10-CM | POA: Diagnosis not present

## 2023-08-05 DIAGNOSIS — Z510 Encounter for antineoplastic radiation therapy: Secondary | ICD-10-CM | POA: Diagnosis not present

## 2023-08-05 DIAGNOSIS — J301 Allergic rhinitis due to pollen: Secondary | ICD-10-CM | POA: Diagnosis not present

## 2023-08-05 DIAGNOSIS — C61 Malignant neoplasm of prostate: Secondary | ICD-10-CM | POA: Diagnosis not present

## 2023-08-05 LAB — RAD ONC ARIA SESSION SUMMARY
Course Elapsed Days: 9
Plan Fractions Treated to Date: 8
Plan Prescribed Dose Per Fraction: 2.5 Gy
Plan Total Fractions Prescribed: 28
Plan Total Prescribed Dose: 70 Gy
Reference Point Dosage Given to Date: 20 Gy
Reference Point Session Dosage Given: 2.5 Gy
Session Number: 8

## 2023-08-06 ENCOUNTER — Ambulatory Visit

## 2023-08-06 ENCOUNTER — Ambulatory Visit: Admission: RE | Admit: 2023-08-06 | Source: Ambulatory Visit

## 2023-08-07 ENCOUNTER — Other Ambulatory Visit: Payer: Self-pay

## 2023-08-07 ENCOUNTER — Ambulatory Visit
Admission: RE | Admit: 2023-08-07 | Discharge: 2023-08-07 | Disposition: A | Discharge status: Home / Self Care | Source: Ambulatory Visit | Attending: Radiation Oncology

## 2023-08-07 DIAGNOSIS — Z510 Encounter for antineoplastic radiation therapy: Secondary | ICD-10-CM | POA: Diagnosis not present

## 2023-08-07 DIAGNOSIS — C61 Malignant neoplasm of prostate: Secondary | ICD-10-CM | POA: Diagnosis not present

## 2023-08-07 DIAGNOSIS — Z191 Hormone sensitive malignancy status: Secondary | ICD-10-CM | POA: Diagnosis not present

## 2023-08-07 LAB — RAD ONC ARIA SESSION SUMMARY
Course Elapsed Days: 11
Plan Fractions Treated to Date: 9
Plan Prescribed Dose Per Fraction: 2.5 Gy
Plan Total Fractions Prescribed: 28
Plan Total Prescribed Dose: 70 Gy
Reference Point Dosage Given to Date: 22.5 Gy
Reference Point Session Dosage Given: 2.5 Gy
Session Number: 9

## 2023-08-10 ENCOUNTER — Other Ambulatory Visit: Payer: Self-pay

## 2023-08-10 ENCOUNTER — Ambulatory Visit
Admission: RE | Admit: 2023-08-10 | Discharge: 2023-08-10 | Disposition: A | Discharge status: Home / Self Care | Source: Ambulatory Visit | Attending: Radiation Oncology

## 2023-08-10 DIAGNOSIS — C61 Malignant neoplasm of prostate: Secondary | ICD-10-CM | POA: Diagnosis not present

## 2023-08-10 DIAGNOSIS — Z510 Encounter for antineoplastic radiation therapy: Secondary | ICD-10-CM | POA: Diagnosis not present

## 2023-08-10 DIAGNOSIS — Z191 Hormone sensitive malignancy status: Secondary | ICD-10-CM | POA: Diagnosis not present

## 2023-08-10 LAB — RAD ONC ARIA SESSION SUMMARY
Course Elapsed Days: 14
Plan Fractions Treated to Date: 10
Plan Prescribed Dose Per Fraction: 2.5 Gy
Plan Total Fractions Prescribed: 28
Plan Total Prescribed Dose: 70 Gy
Reference Point Dosage Given to Date: 25 Gy
Reference Point Session Dosage Given: 2.5 Gy
Session Number: 10

## 2023-08-11 ENCOUNTER — Ambulatory Visit
Admission: RE | Admit: 2023-08-11 | Discharge: 2023-08-11 | Disposition: A | Discharge status: Home / Self Care | Source: Ambulatory Visit | Attending: Radiation Oncology | Admitting: Radiation Oncology

## 2023-08-11 ENCOUNTER — Other Ambulatory Visit: Payer: Self-pay

## 2023-08-11 DIAGNOSIS — C61 Malignant neoplasm of prostate: Secondary | ICD-10-CM | POA: Diagnosis not present

## 2023-08-11 DIAGNOSIS — Z191 Hormone sensitive malignancy status: Secondary | ICD-10-CM | POA: Diagnosis not present

## 2023-08-11 DIAGNOSIS — Z510 Encounter for antineoplastic radiation therapy: Secondary | ICD-10-CM | POA: Diagnosis not present

## 2023-08-11 LAB — RAD ONC ARIA SESSION SUMMARY
Course Elapsed Days: 15
Plan Fractions Treated to Date: 11
Plan Prescribed Dose Per Fraction: 2.5 Gy
Plan Total Fractions Prescribed: 28
Plan Total Prescribed Dose: 70 Gy
Reference Point Dosage Given to Date: 27.5 Gy
Reference Point Session Dosage Given: 2.5 Gy
Session Number: 11

## 2023-08-12 ENCOUNTER — Other Ambulatory Visit: Payer: Self-pay

## 2023-08-12 ENCOUNTER — Ambulatory Visit
Admission: RE | Admit: 2023-08-12 | Discharge: 2023-08-12 | Disposition: A | Discharge status: Home / Self Care | Source: Ambulatory Visit | Attending: Radiation Oncology | Admitting: Radiation Oncology

## 2023-08-12 DIAGNOSIS — J3081 Allergic rhinitis due to animal (cat) (dog) hair and dander: Secondary | ICD-10-CM | POA: Diagnosis not present

## 2023-08-12 DIAGNOSIS — Z191 Hormone sensitive malignancy status: Secondary | ICD-10-CM | POA: Diagnosis not present

## 2023-08-12 DIAGNOSIS — Z510 Encounter for antineoplastic radiation therapy: Secondary | ICD-10-CM | POA: Diagnosis not present

## 2023-08-12 DIAGNOSIS — C61 Malignant neoplasm of prostate: Secondary | ICD-10-CM | POA: Diagnosis not present

## 2023-08-12 DIAGNOSIS — J3089 Other allergic rhinitis: Secondary | ICD-10-CM | POA: Diagnosis not present

## 2023-08-12 DIAGNOSIS — J301 Allergic rhinitis due to pollen: Secondary | ICD-10-CM | POA: Diagnosis not present

## 2023-08-12 LAB — RAD ONC ARIA SESSION SUMMARY
Course Elapsed Days: 16
Plan Fractions Treated to Date: 12
Plan Prescribed Dose Per Fraction: 2.5 Gy
Plan Total Fractions Prescribed: 28
Plan Total Prescribed Dose: 70 Gy
Reference Point Dosage Given to Date: 30 Gy
Reference Point Session Dosage Given: 2.5 Gy
Session Number: 12

## 2023-08-13 ENCOUNTER — Ambulatory Visit
Admission: RE | Admit: 2023-08-13 | Discharge: 2023-08-13 | Discharge status: Home / Self Care | Source: Ambulatory Visit | Attending: Radiation Oncology

## 2023-08-13 ENCOUNTER — Other Ambulatory Visit: Payer: Self-pay

## 2023-08-13 DIAGNOSIS — Z510 Encounter for antineoplastic radiation therapy: Secondary | ICD-10-CM | POA: Diagnosis not present

## 2023-08-13 DIAGNOSIS — C61 Malignant neoplasm of prostate: Secondary | ICD-10-CM | POA: Diagnosis not present

## 2023-08-13 DIAGNOSIS — Z191 Hormone sensitive malignancy status: Secondary | ICD-10-CM | POA: Diagnosis not present

## 2023-08-13 LAB — RAD ONC ARIA SESSION SUMMARY
Course Elapsed Days: 17
Plan Fractions Treated to Date: 13
Plan Prescribed Dose Per Fraction: 2.5 Gy
Plan Total Fractions Prescribed: 28
Plan Total Prescribed Dose: 70 Gy
Reference Point Dosage Given to Date: 32.5 Gy
Reference Point Session Dosage Given: 2.5 Gy
Session Number: 13

## 2023-08-14 ENCOUNTER — Other Ambulatory Visit: Payer: Self-pay

## 2023-08-14 ENCOUNTER — Ambulatory Visit
Admission: RE | Admit: 2023-08-14 | Discharge: 2023-08-14 | Disposition: A | Discharge status: Home / Self Care | Source: Ambulatory Visit | Attending: Radiation Oncology | Admitting: Radiation Oncology

## 2023-08-14 DIAGNOSIS — C61 Malignant neoplasm of prostate: Secondary | ICD-10-CM | POA: Diagnosis not present

## 2023-08-14 DIAGNOSIS — Z191 Hormone sensitive malignancy status: Secondary | ICD-10-CM | POA: Diagnosis not present

## 2023-08-14 DIAGNOSIS — Z510 Encounter for antineoplastic radiation therapy: Secondary | ICD-10-CM | POA: Diagnosis not present

## 2023-08-14 LAB — RAD ONC ARIA SESSION SUMMARY
Course Elapsed Days: 18
Plan Fractions Treated to Date: 14
Plan Prescribed Dose Per Fraction: 2.5 Gy
Plan Total Fractions Prescribed: 28
Plan Total Prescribed Dose: 70 Gy
Reference Point Dosage Given to Date: 35 Gy
Reference Point Session Dosage Given: 2.5 Gy
Session Number: 14

## 2023-08-17 ENCOUNTER — Ambulatory Visit
Admission: RE | Admit: 2023-08-17 | Discharge: 2023-08-17 | Disposition: A | Discharge status: Home / Self Care | Source: Ambulatory Visit | Attending: Radiation Oncology | Admitting: Radiation Oncology

## 2023-08-17 ENCOUNTER — Other Ambulatory Visit: Payer: Self-pay

## 2023-08-17 DIAGNOSIS — C61 Malignant neoplasm of prostate: Secondary | ICD-10-CM | POA: Diagnosis not present

## 2023-08-17 DIAGNOSIS — Z510 Encounter for antineoplastic radiation therapy: Secondary | ICD-10-CM | POA: Diagnosis not present

## 2023-08-17 DIAGNOSIS — Z191 Hormone sensitive malignancy status: Secondary | ICD-10-CM | POA: Diagnosis not present

## 2023-08-17 LAB — RAD ONC ARIA SESSION SUMMARY
Course Elapsed Days: 21
Plan Fractions Treated to Date: 15
Plan Prescribed Dose Per Fraction: 2.5 Gy
Plan Total Fractions Prescribed: 28
Plan Total Prescribed Dose: 70 Gy
Reference Point Dosage Given to Date: 37.5 Gy
Reference Point Session Dosage Given: 2.5 Gy
Session Number: 15

## 2023-08-18 ENCOUNTER — Other Ambulatory Visit: Payer: Self-pay

## 2023-08-18 ENCOUNTER — Ambulatory Visit
Admission: RE | Admit: 2023-08-18 | Discharge: 2023-08-18 | Disposition: A | Discharge status: Home / Self Care | Source: Ambulatory Visit | Attending: Radiation Oncology | Admitting: Radiation Oncology

## 2023-08-18 DIAGNOSIS — Z191 Hormone sensitive malignancy status: Secondary | ICD-10-CM | POA: Diagnosis not present

## 2023-08-18 DIAGNOSIS — Z510 Encounter for antineoplastic radiation therapy: Secondary | ICD-10-CM | POA: Diagnosis not present

## 2023-08-18 DIAGNOSIS — C61 Malignant neoplasm of prostate: Secondary | ICD-10-CM | POA: Diagnosis not present

## 2023-08-18 LAB — RAD ONC ARIA SESSION SUMMARY
Course Elapsed Days: 22
Plan Fractions Treated to Date: 16
Plan Prescribed Dose Per Fraction: 2.5 Gy
Plan Total Fractions Prescribed: 28
Plan Total Prescribed Dose: 70 Gy
Reference Point Dosage Given to Date: 40 Gy
Reference Point Session Dosage Given: 2.5 Gy
Session Number: 16

## 2023-08-19 ENCOUNTER — Other Ambulatory Visit: Payer: Self-pay

## 2023-08-19 ENCOUNTER — Ambulatory Visit
Admission: RE | Admit: 2023-08-19 | Discharge: 2023-08-19 | Disposition: A | Discharge status: Home / Self Care | Source: Ambulatory Visit | Attending: Radiation Oncology | Admitting: Radiation Oncology

## 2023-08-19 DIAGNOSIS — C61 Malignant neoplasm of prostate: Secondary | ICD-10-CM | POA: Diagnosis not present

## 2023-08-19 DIAGNOSIS — Z510 Encounter for antineoplastic radiation therapy: Secondary | ICD-10-CM | POA: Diagnosis not present

## 2023-08-19 DIAGNOSIS — J301 Allergic rhinitis due to pollen: Secondary | ICD-10-CM | POA: Diagnosis not present

## 2023-08-19 DIAGNOSIS — J4530 Mild persistent asthma, uncomplicated: Secondary | ICD-10-CM | POA: Diagnosis not present

## 2023-08-19 DIAGNOSIS — J3081 Allergic rhinitis due to animal (cat) (dog) hair and dander: Secondary | ICD-10-CM | POA: Diagnosis not present

## 2023-08-19 DIAGNOSIS — J3089 Other allergic rhinitis: Secondary | ICD-10-CM | POA: Diagnosis not present

## 2023-08-19 DIAGNOSIS — Z191 Hormone sensitive malignancy status: Secondary | ICD-10-CM | POA: Diagnosis not present

## 2023-08-19 LAB — RAD ONC ARIA SESSION SUMMARY
Course Elapsed Days: 23
Plan Fractions Treated to Date: 17
Plan Prescribed Dose Per Fraction: 2.5 Gy
Plan Total Fractions Prescribed: 28
Plan Total Prescribed Dose: 70 Gy
Reference Point Dosage Given to Date: 42.5 Gy
Reference Point Session Dosage Given: 2.5 Gy
Session Number: 17

## 2023-08-20 ENCOUNTER — Ambulatory Visit

## 2023-08-20 ENCOUNTER — Other Ambulatory Visit: Payer: Self-pay

## 2023-08-20 ENCOUNTER — Ambulatory Visit
Admission: RE | Admit: 2023-08-20 | Discharge: 2023-08-20 | Disposition: A | Discharge status: Home / Self Care | Source: Ambulatory Visit | Attending: Radiation Oncology | Admitting: Radiation Oncology

## 2023-08-20 DIAGNOSIS — Z191 Hormone sensitive malignancy status: Secondary | ICD-10-CM | POA: Diagnosis not present

## 2023-08-20 DIAGNOSIS — Z510 Encounter for antineoplastic radiation therapy: Secondary | ICD-10-CM | POA: Diagnosis not present

## 2023-08-20 DIAGNOSIS — C61 Malignant neoplasm of prostate: Secondary | ICD-10-CM | POA: Diagnosis not present

## 2023-08-20 LAB — RAD ONC ARIA SESSION SUMMARY
Course Elapsed Days: 24
Plan Fractions Treated to Date: 18
Plan Prescribed Dose Per Fraction: 2.5 Gy
Plan Total Fractions Prescribed: 28
Plan Total Prescribed Dose: 70 Gy
Reference Point Dosage Given to Date: 45 Gy
Reference Point Session Dosage Given: 2.5 Gy
Session Number: 18

## 2023-08-21 ENCOUNTER — Ambulatory Visit
Admission: RE | Admit: 2023-08-21 | Discharge: 2023-08-21 | Disposition: A | Discharge status: Home / Self Care | Source: Ambulatory Visit | Attending: Radiation Oncology | Admitting: Radiation Oncology

## 2023-08-21 ENCOUNTER — Other Ambulatory Visit: Payer: Self-pay

## 2023-08-21 DIAGNOSIS — Z191 Hormone sensitive malignancy status: Secondary | ICD-10-CM | POA: Diagnosis not present

## 2023-08-21 DIAGNOSIS — C61 Malignant neoplasm of prostate: Secondary | ICD-10-CM | POA: Diagnosis not present

## 2023-08-21 DIAGNOSIS — Z510 Encounter for antineoplastic radiation therapy: Secondary | ICD-10-CM | POA: Diagnosis not present

## 2023-08-21 LAB — RAD ONC ARIA SESSION SUMMARY
Course Elapsed Days: 25
Plan Fractions Treated to Date: 19
Plan Prescribed Dose Per Fraction: 2.5 Gy
Plan Total Fractions Prescribed: 28
Plan Total Prescribed Dose: 70 Gy
Reference Point Dosage Given to Date: 47.5 Gy
Reference Point Session Dosage Given: 2.5 Gy
Session Number: 19

## 2023-08-25 ENCOUNTER — Other Ambulatory Visit: Payer: Self-pay

## 2023-08-25 ENCOUNTER — Ambulatory Visit
Admission: RE | Admit: 2023-08-25 | Discharge: 2023-08-25 | Disposition: A | Discharge status: Home / Self Care | Source: Ambulatory Visit | Attending: Radiation Oncology

## 2023-08-25 DIAGNOSIS — Z510 Encounter for antineoplastic radiation therapy: Secondary | ICD-10-CM | POA: Diagnosis not present

## 2023-08-25 DIAGNOSIS — Z191 Hormone sensitive malignancy status: Secondary | ICD-10-CM | POA: Diagnosis not present

## 2023-08-25 DIAGNOSIS — C61 Malignant neoplasm of prostate: Secondary | ICD-10-CM | POA: Diagnosis not present

## 2023-08-25 LAB — RAD ONC ARIA SESSION SUMMARY
Course Elapsed Days: 29
Plan Fractions Treated to Date: 20
Plan Prescribed Dose Per Fraction: 2.5 Gy
Plan Total Fractions Prescribed: 28
Plan Total Prescribed Dose: 70 Gy
Reference Point Dosage Given to Date: 50 Gy
Reference Point Session Dosage Given: 2.5 Gy
Session Number: 20

## 2023-08-26 ENCOUNTER — Ambulatory Visit
Admission: RE | Admit: 2023-08-26 | Discharge: 2023-08-26 | Disposition: A | Discharge status: Home / Self Care | Source: Ambulatory Visit | Attending: Radiation Oncology

## 2023-08-26 ENCOUNTER — Other Ambulatory Visit: Payer: Self-pay

## 2023-08-26 DIAGNOSIS — Z191 Hormone sensitive malignancy status: Secondary | ICD-10-CM | POA: Diagnosis not present

## 2023-08-26 DIAGNOSIS — Z510 Encounter for antineoplastic radiation therapy: Secondary | ICD-10-CM | POA: Diagnosis not present

## 2023-08-26 DIAGNOSIS — C61 Malignant neoplasm of prostate: Secondary | ICD-10-CM | POA: Diagnosis not present

## 2023-08-26 LAB — RAD ONC ARIA SESSION SUMMARY
Course Elapsed Days: 30
Plan Fractions Treated to Date: 21
Plan Prescribed Dose Per Fraction: 2.5 Gy
Plan Total Fractions Prescribed: 28
Plan Total Prescribed Dose: 70 Gy
Reference Point Dosage Given to Date: 52.5 Gy
Reference Point Session Dosage Given: 2.5 Gy
Session Number: 21

## 2023-08-27 ENCOUNTER — Other Ambulatory Visit: Payer: Self-pay

## 2023-08-27 ENCOUNTER — Ambulatory Visit
Admission: RE | Admit: 2023-08-27 | Discharge: 2023-08-27 | Disposition: A | Discharge status: Home / Self Care | Source: Ambulatory Visit | Attending: Radiation Oncology | Admitting: Radiation Oncology

## 2023-08-27 ENCOUNTER — Ambulatory Visit
Admission: RE | Admit: 2023-08-27 | Discharge: 2023-08-27 | Disposition: A | Discharge status: Home / Self Care | Source: Ambulatory Visit | Attending: Radiation Oncology

## 2023-08-27 DIAGNOSIS — J3081 Allergic rhinitis due to animal (cat) (dog) hair and dander: Secondary | ICD-10-CM | POA: Diagnosis not present

## 2023-08-27 DIAGNOSIS — J301 Allergic rhinitis due to pollen: Secondary | ICD-10-CM | POA: Diagnosis not present

## 2023-08-27 DIAGNOSIS — Z191 Hormone sensitive malignancy status: Secondary | ICD-10-CM | POA: Diagnosis not present

## 2023-08-27 DIAGNOSIS — Z510 Encounter for antineoplastic radiation therapy: Secondary | ICD-10-CM | POA: Diagnosis not present

## 2023-08-27 DIAGNOSIS — C61 Malignant neoplasm of prostate: Secondary | ICD-10-CM | POA: Diagnosis not present

## 2023-08-27 DIAGNOSIS — J3089 Other allergic rhinitis: Secondary | ICD-10-CM | POA: Diagnosis not present

## 2023-08-27 LAB — RAD ONC ARIA SESSION SUMMARY
Course Elapsed Days: 31
Plan Fractions Treated to Date: 22
Plan Prescribed Dose Per Fraction: 2.5 Gy
Plan Total Fractions Prescribed: 28
Plan Total Prescribed Dose: 70 Gy
Reference Point Dosage Given to Date: 55 Gy
Reference Point Session Dosage Given: 2.5 Gy
Session Number: 22

## 2023-08-28 ENCOUNTER — Ambulatory Visit
Admission: RE | Admit: 2023-08-28 | Discharge: 2023-08-28 | Disposition: A | Discharge status: Home / Self Care | Source: Ambulatory Visit | Attending: Radiation Oncology | Admitting: Radiation Oncology

## 2023-08-28 ENCOUNTER — Other Ambulatory Visit: Payer: Self-pay

## 2023-08-28 DIAGNOSIS — Z510 Encounter for antineoplastic radiation therapy: Secondary | ICD-10-CM | POA: Diagnosis not present

## 2023-08-28 DIAGNOSIS — C61 Malignant neoplasm of prostate: Secondary | ICD-10-CM | POA: Diagnosis not present

## 2023-08-28 DIAGNOSIS — Z191 Hormone sensitive malignancy status: Secondary | ICD-10-CM | POA: Diagnosis not present

## 2023-08-28 LAB — RAD ONC ARIA SESSION SUMMARY
Course Elapsed Days: 32
Plan Fractions Treated to Date: 23
Plan Prescribed Dose Per Fraction: 2.5 Gy
Plan Total Fractions Prescribed: 28
Plan Total Prescribed Dose: 70 Gy
Reference Point Dosage Given to Date: 57.5 Gy
Reference Point Session Dosage Given: 2.5 Gy
Session Number: 23

## 2023-08-31 ENCOUNTER — Ambulatory Visit

## 2023-09-01 ENCOUNTER — Other Ambulatory Visit: Payer: Self-pay

## 2023-09-01 ENCOUNTER — Ambulatory Visit
Admission: RE | Admit: 2023-09-01 | Discharge: 2023-09-01 | Disposition: A | Discharge status: Home / Self Care | Source: Ambulatory Visit | Attending: Radiation Oncology | Admitting: Radiation Oncology

## 2023-09-01 DIAGNOSIS — C61 Malignant neoplasm of prostate: Secondary | ICD-10-CM | POA: Insufficient documentation

## 2023-09-01 DIAGNOSIS — Z191 Hormone sensitive malignancy status: Secondary | ICD-10-CM | POA: Diagnosis not present

## 2023-09-01 DIAGNOSIS — Z510 Encounter for antineoplastic radiation therapy: Secondary | ICD-10-CM | POA: Diagnosis not present

## 2023-09-01 LAB — RAD ONC ARIA SESSION SUMMARY
Course Elapsed Days: 36
Plan Fractions Treated to Date: 24
Plan Prescribed Dose Per Fraction: 2.5 Gy
Plan Total Fractions Prescribed: 28
Plan Total Prescribed Dose: 70 Gy
Reference Point Dosage Given to Date: 60 Gy
Reference Point Session Dosage Given: 2.5 Gy
Session Number: 24

## 2023-09-02 ENCOUNTER — Other Ambulatory Visit: Payer: Self-pay

## 2023-09-02 ENCOUNTER — Ambulatory Visit
Admission: RE | Admit: 2023-09-02 | Discharge: 2023-09-02 | Disposition: A | Discharge status: Home / Self Care | Source: Ambulatory Visit | Attending: Radiation Oncology | Admitting: Radiation Oncology

## 2023-09-02 DIAGNOSIS — J3089 Other allergic rhinitis: Secondary | ICD-10-CM | POA: Diagnosis not present

## 2023-09-02 DIAGNOSIS — J3081 Allergic rhinitis due to animal (cat) (dog) hair and dander: Secondary | ICD-10-CM | POA: Diagnosis not present

## 2023-09-02 DIAGNOSIS — C61 Malignant neoplasm of prostate: Secondary | ICD-10-CM | POA: Diagnosis not present

## 2023-09-02 DIAGNOSIS — Z510 Encounter for antineoplastic radiation therapy: Secondary | ICD-10-CM | POA: Diagnosis not present

## 2023-09-02 DIAGNOSIS — Z191 Hormone sensitive malignancy status: Secondary | ICD-10-CM | POA: Diagnosis not present

## 2023-09-02 DIAGNOSIS — J301 Allergic rhinitis due to pollen: Secondary | ICD-10-CM | POA: Diagnosis not present

## 2023-09-02 LAB — RAD ONC ARIA SESSION SUMMARY
Course Elapsed Days: 37
Plan Fractions Treated to Date: 25
Plan Prescribed Dose Per Fraction: 2.5 Gy
Plan Total Fractions Prescribed: 28
Plan Total Prescribed Dose: 70 Gy
Reference Point Dosage Given to Date: 62.5 Gy
Reference Point Session Dosage Given: 2.5 Gy
Session Number: 25

## 2023-09-03 ENCOUNTER — Other Ambulatory Visit: Payer: Self-pay

## 2023-09-03 ENCOUNTER — Ambulatory Visit
Admission: RE | Admit: 2023-09-03 | Discharge: 2023-09-03 | Disposition: A | Discharge status: Home / Self Care | Source: Ambulatory Visit | Attending: Radiation Oncology | Admitting: Radiation Oncology

## 2023-09-03 ENCOUNTER — Ambulatory Visit

## 2023-09-03 ENCOUNTER — Ambulatory Visit
Admission: RE | Admit: 2023-09-03 | Discharge: 2023-09-03 | Discharge status: Home / Self Care | Source: Ambulatory Visit | Attending: Radiation Oncology

## 2023-09-03 DIAGNOSIS — Z510 Encounter for antineoplastic radiation therapy: Secondary | ICD-10-CM | POA: Diagnosis not present

## 2023-09-03 DIAGNOSIS — C61 Malignant neoplasm of prostate: Secondary | ICD-10-CM | POA: Diagnosis not present

## 2023-09-03 DIAGNOSIS — Z191 Hormone sensitive malignancy status: Secondary | ICD-10-CM | POA: Diagnosis not present

## 2023-09-03 LAB — RAD ONC ARIA SESSION SUMMARY
Course Elapsed Days: 38
Plan Fractions Treated to Date: 26
Plan Prescribed Dose Per Fraction: 2.5 Gy
Plan Total Fractions Prescribed: 28
Plan Total Prescribed Dose: 70 Gy
Reference Point Dosage Given to Date: 65 Gy
Reference Point Session Dosage Given: 2.5 Gy
Session Number: 26

## 2023-09-04 ENCOUNTER — Ambulatory Visit
Admission: RE | Admit: 2023-09-04 | Discharge: 2023-09-04 | Disposition: A | Discharge status: Home / Self Care | Source: Ambulatory Visit | Attending: Radiation Oncology | Admitting: Radiation Oncology

## 2023-09-04 ENCOUNTER — Other Ambulatory Visit: Payer: Self-pay

## 2023-09-04 DIAGNOSIS — Z510 Encounter for antineoplastic radiation therapy: Secondary | ICD-10-CM | POA: Diagnosis not present

## 2023-09-04 DIAGNOSIS — Z191 Hormone sensitive malignancy status: Secondary | ICD-10-CM | POA: Diagnosis not present

## 2023-09-04 DIAGNOSIS — C61 Malignant neoplasm of prostate: Secondary | ICD-10-CM | POA: Diagnosis not present

## 2023-09-04 LAB — RAD ONC ARIA SESSION SUMMARY
Course Elapsed Days: 39
Plan Fractions Treated to Date: 27
Plan Prescribed Dose Per Fraction: 2.5 Gy
Plan Total Fractions Prescribed: 28
Plan Total Prescribed Dose: 70 Gy
Reference Point Dosage Given to Date: 67.5 Gy
Reference Point Session Dosage Given: 2.5 Gy
Session Number: 27

## 2023-09-07 ENCOUNTER — Ambulatory Visit
Admission: RE | Admit: 2023-09-07 | Discharge: 2023-09-07 | Disposition: A | Discharge status: Home / Self Care | Source: Ambulatory Visit | Attending: Radiation Oncology | Admitting: Radiation Oncology

## 2023-09-07 ENCOUNTER — Ambulatory Visit

## 2023-09-07 ENCOUNTER — Other Ambulatory Visit: Payer: Self-pay

## 2023-09-07 DIAGNOSIS — Z510 Encounter for antineoplastic radiation therapy: Secondary | ICD-10-CM | POA: Diagnosis not present

## 2023-09-07 DIAGNOSIS — Z191 Hormone sensitive malignancy status: Secondary | ICD-10-CM | POA: Diagnosis not present

## 2023-09-07 DIAGNOSIS — C61 Malignant neoplasm of prostate: Secondary | ICD-10-CM | POA: Diagnosis not present

## 2023-09-07 LAB — RAD ONC ARIA SESSION SUMMARY
Course Elapsed Days: 42
Plan Fractions Treated to Date: 28
Plan Prescribed Dose Per Fraction: 2.5 Gy
Plan Total Fractions Prescribed: 28
Plan Total Prescribed Dose: 70 Gy
Reference Point Dosage Given to Date: 70 Gy
Reference Point Session Dosage Given: 2.5 Gy
Session Number: 28

## 2023-09-08 ENCOUNTER — Ambulatory Visit

## 2023-09-08 DIAGNOSIS — J301 Allergic rhinitis due to pollen: Secondary | ICD-10-CM | POA: Diagnosis not present

## 2023-09-08 NOTE — Radiation Completion Notes (Addendum)
  Radiation Oncology         [phone] ________________________________  Name: David Olsen MRN: [id number]  Date: 09/07/2023  DOB: [birth year]  Referring Physician: RICARDO LIKENS, M.D. Date of Service: 2023-09-08 Radiation Oncologist: Adina Barge, M.D. [facility]     RADIATION ONCOLOGY END OF TREATMENT NOTE     Diagnosis: 63 y.o. gentleman with Stage T1c adenocarcinoma of the prostate with Gleason score of 3+4, and PSA of 6.   Intent: Curative     ==========DELIVERED PLANS==========  First Treatment Date: 2023-07-27 Last Treatment Date: 2023-09-07   Plan Name: Prostate Site: Prostate Technique: IMRT Mode: Photon Dose Per Fraction: 2.5 Gy Prescribed Dose (Delivered / Prescribed): 70 Gy / 70 Gy Prescribed Fxs (Delivered / Prescribed): 28 / 28     ==========ON TREATMENT VISIT DATES========== 2023-07-30, 2023-08-07, 2023-08-14, 2023-08-20, 2023-08-27, 2023-09-03    See weekly On Treatment Notes in Epic for details in the Media tab (listed as Progress notes on the On Treatment Visit Dates listed above).  He tolerated treatments well with only mild increased LUTS and modest fatigue.  The patient will receive a call in about one month from the radiation oncology department. He will continue follow up with his urologist, Dr. LIKENS as well.  ------------------------------------------------   Donnice Barge, MD [facility]  Radiation Oncology Direct Dial: [phone]  Fax: [phone] [email or web address]  Skype  [email or web address]

## 2023-09-09 DIAGNOSIS — J3089 Other allergic rhinitis: Secondary | ICD-10-CM | POA: Diagnosis not present

## 2023-09-10 DIAGNOSIS — J3089 Other allergic rhinitis: Secondary | ICD-10-CM | POA: Diagnosis not present

## 2023-09-10 DIAGNOSIS — J301 Allergic rhinitis due to pollen: Secondary | ICD-10-CM | POA: Diagnosis not present

## 2023-09-10 DIAGNOSIS — J3081 Allergic rhinitis due to animal (cat) (dog) hair and dander: Secondary | ICD-10-CM | POA: Diagnosis not present

## 2023-09-11 NOTE — Progress Notes (Signed)
 Patient was a RadOnc Consult on 01/19/23 for his stage T1c adenocarcinoma of the prostate with Gleason score of 3+4, and PSA of 6.  Patient proceed with treatment recommendations of 5.5 weeks of daily radiation and had his final radiation treatment on 09/07/23.   Patient is scheduled for a post treatment nurse call on 10/06/23 and has his first post treatment PSA on 7/16 at [facility].

## 2023-09-18 DIAGNOSIS — J3081 Allergic rhinitis due to animal (cat) (dog) hair and dander: Secondary | ICD-10-CM | POA: Diagnosis not present

## 2023-09-18 DIAGNOSIS — J301 Allergic rhinitis due to pollen: Secondary | ICD-10-CM | POA: Diagnosis not present

## 2023-09-18 DIAGNOSIS — J3089 Other allergic rhinitis: Secondary | ICD-10-CM | POA: Diagnosis not present

## 2023-09-23 DIAGNOSIS — J3089 Other allergic rhinitis: Secondary | ICD-10-CM | POA: Diagnosis not present

## 2023-09-23 DIAGNOSIS — J301 Allergic rhinitis due to pollen: Secondary | ICD-10-CM | POA: Diagnosis not present

## 2023-09-23 DIAGNOSIS — J3081 Allergic rhinitis due to animal (cat) (dog) hair and dander: Secondary | ICD-10-CM | POA: Diagnosis not present

## 2023-09-25 ENCOUNTER — Other Ambulatory Visit: Payer: Self-pay | Admitting: Urology

## 2023-09-25 DIAGNOSIS — C61 Malignant neoplasm of prostate: Secondary | ICD-10-CM

## 2023-09-28 DIAGNOSIS — J301 Allergic rhinitis due to pollen: Secondary | ICD-10-CM | POA: Diagnosis not present

## 2023-09-28 DIAGNOSIS — J3081 Allergic rhinitis due to animal (cat) (dog) hair and dander: Secondary | ICD-10-CM | POA: Diagnosis not present

## 2023-09-28 DIAGNOSIS — J3089 Other allergic rhinitis: Secondary | ICD-10-CM | POA: Diagnosis not present

## 2023-10-06 ENCOUNTER — Ambulatory Visit
Admission: RE | Admit: 2023-10-06 | Discharge: 2023-10-06 | Disposition: A | Discharge status: Home / Self Care | Source: Ambulatory Visit | Attending: Internal Medicine | Admitting: Internal Medicine

## 2023-10-06 DIAGNOSIS — C61 Malignant neoplasm of prostate: Secondary | ICD-10-CM | POA: Insufficient documentation

## 2023-10-06 DIAGNOSIS — J3081 Allergic rhinitis due to animal (cat) (dog) hair and dander: Secondary | ICD-10-CM | POA: Diagnosis not present

## 2023-10-06 DIAGNOSIS — J301 Allergic rhinitis due to pollen: Secondary | ICD-10-CM | POA: Diagnosis not present

## 2023-10-06 DIAGNOSIS — J3089 Other allergic rhinitis: Secondary | ICD-10-CM | POA: Diagnosis not present

## 2023-10-06 NOTE — Progress Notes (Signed)
  Radiation Oncology         [phone] ________________________________  Name: [name] MRN: [id number]  Date of Service: 10/06/2023  DOB: 1961/03/06  Post Treatment Telephone Note  Diagnosis:  Stage T1c adenocarcinoma of the prostate with Gleason score of 3+4, and PSA of 6. (as documented in provider EOT note)  Pre Treatment IPSS Score: 11 (as documented in the provider consult note)  The patient was available for call today.   Symptoms of fatigue have improved since completing therapy.  Symptoms of bladder changes have improved since completing therapy. Current symptoms include none, and medications for bladder symptoms include none.  Symptoms of bowel changes have improved since completing therapy. Current symptoms include none, and medications for bowel symptoms include none.   Post Treatment IPSS Score: IPSS Questionnaire (AUA-7): Over the past month.   1)  How often have you had a sensation of not emptying your bladder completely after you finish urinating?  0 - Not at all  2)  How often have you had to urinate again less than two hours after you finished urinating? 1 - Less than 1 time in 5  3)  How often have you found you stopped and started again several times when you urinated?  0 - Not at all  4) How difficult have you found it to postpone urination?  0 - Not at all  5) How often have you had a weak urinary stream?  0 - Not at all  6) How often have you had to push or strain to begin urination?  0 - Not at all  7) How many times did you most typically get up to urinate from the time you went to bed until the time you got up in the morning?  1 - 1 time  Total score:  2. Which indicates mild symptoms  0-7 mildly symptomatic   8-19 moderately symptomatic   20-35 severely symptomatic   Patient has a scheduled follow up visit with his urologist, Dr. Alvaro, on 10/14/2023 for ongoing surveillance. He was counseled that PSA levels will be drawn in the urology office,  and was reassured that additional time is expected to improve bowel and bladder symptoms. He was encouraged to call back with concerns or questions regarding radiation.   This concludes the interaction.  Rosaline Minerva, LPN

## 2023-10-13 DIAGNOSIS — C61 Malignant neoplasm of prostate: Secondary | ICD-10-CM | POA: Diagnosis not present

## 2023-10-14 DIAGNOSIS — J301 Allergic rhinitis due to pollen: Secondary | ICD-10-CM | POA: Diagnosis not present

## 2023-10-14 DIAGNOSIS — J3081 Allergic rhinitis due to animal (cat) (dog) hair and dander: Secondary | ICD-10-CM | POA: Diagnosis not present

## 2023-10-14 DIAGNOSIS — J3089 Other allergic rhinitis: Secondary | ICD-10-CM | POA: Diagnosis not present

## 2023-10-14 IMAGING — CT CT CARDIAC CORONARY ARTERY CALCIUM SCORE
3 series · 14 of 20 positions shown, 16 images · non-contrast
Comparison: None.
COMPARISON: None.

Addendum:
EXAM:
OVER-READ INTERPRETATION  CT CHEST

The following report is an over-read performed by radiologist Dr.
Leroy Sol [REDACTED] on 09/09/2021. This
over-read does not include interpretation of cardiac or coronary
anatomy or pathology. The interpretation by the cardiologist is
attached.
TECHNIQUE: A gated, non-contrast computed tomography scan of the heart was
performed using 3mm slice thickness. Axial images were analyzed on a
dedicated workstation. Calcium scoring of the coronary arteries was
performed using the Agatston method.

[Series 2: cascseq 2.0 sa36 70% (id) · axial · 0.39mm/px · z∈[-277,-187]mm · 4 of 76 slices shown]
[im 16/76  vessel]
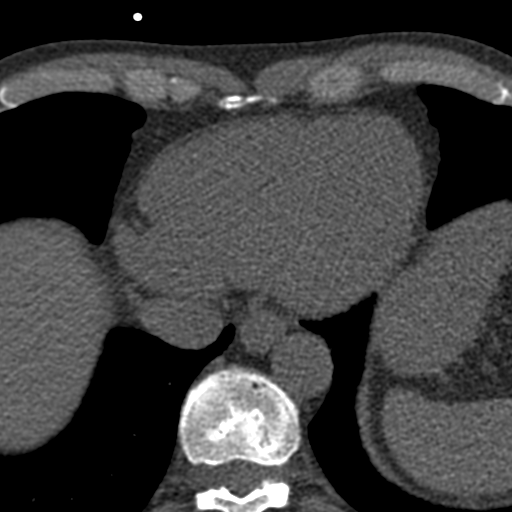
[im 31/76  vessel]
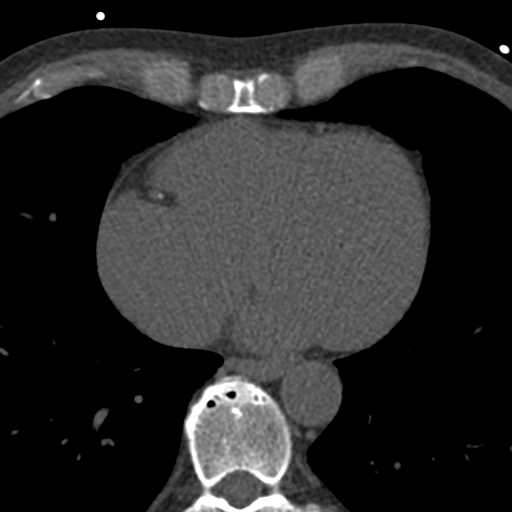
[im 46/76  vessel]
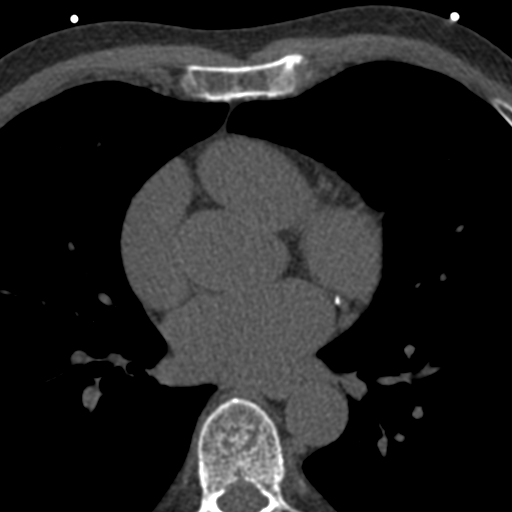
[im 61/76  vessel]
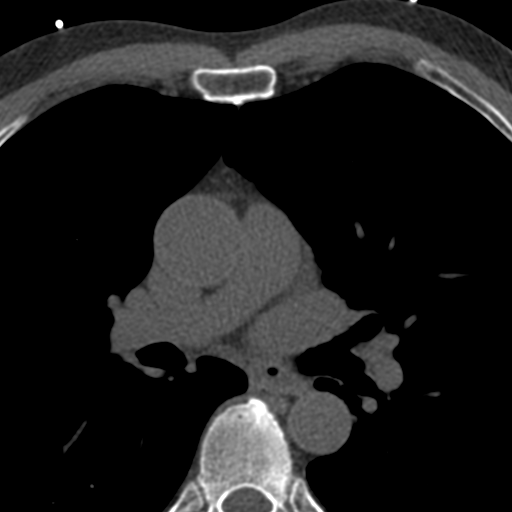

[Series 3: cascseq 2.0 bf37 st · axial · 0.74mm/px · z∈[-283,-183]mm · 5 of 76 slices shown, 7 images]
[im 13/76  vessel]
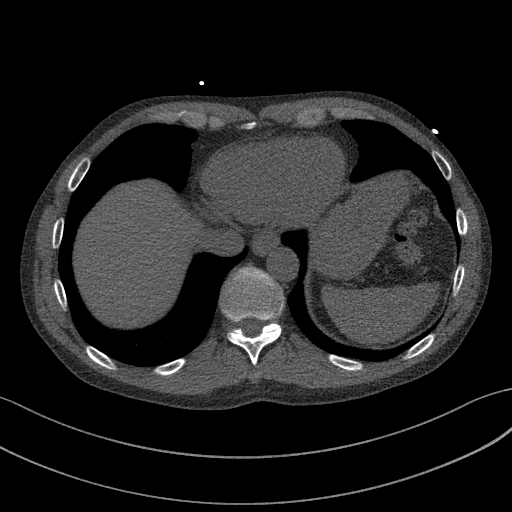
[im 13/76  lung]
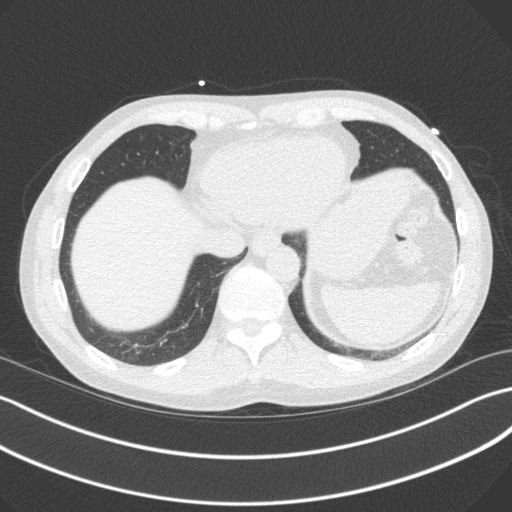
[im 26/76  vessel]
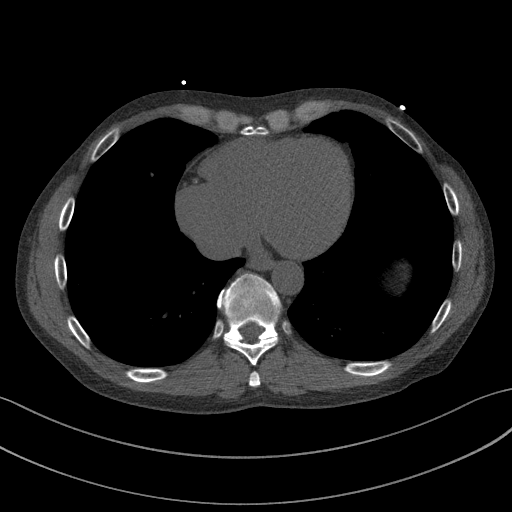
[im 38/76  vessel]
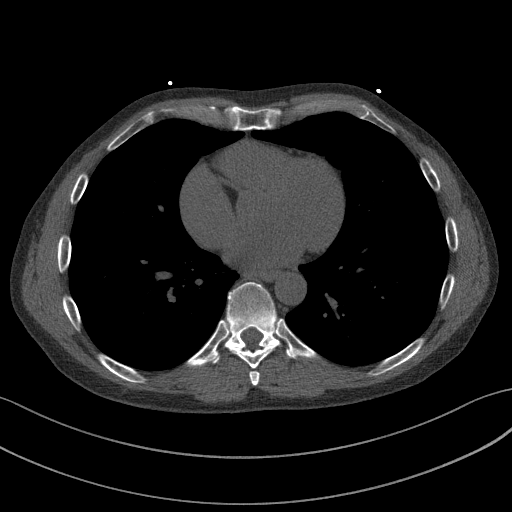
[im 51/76  vessel]
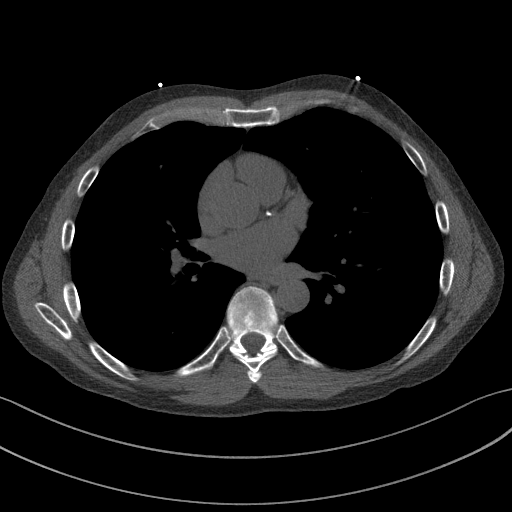
[im 63/76  vessel]
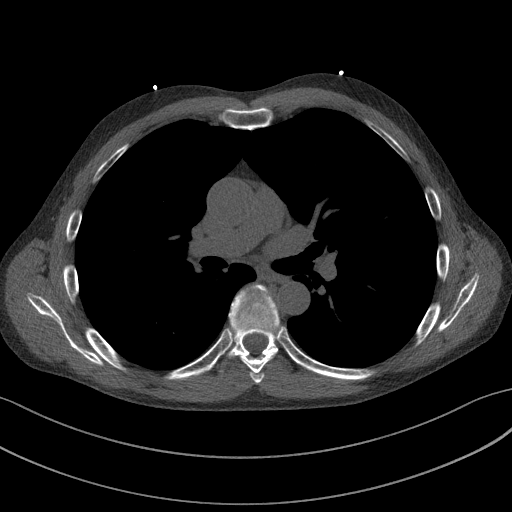
[im 63/76  lung]
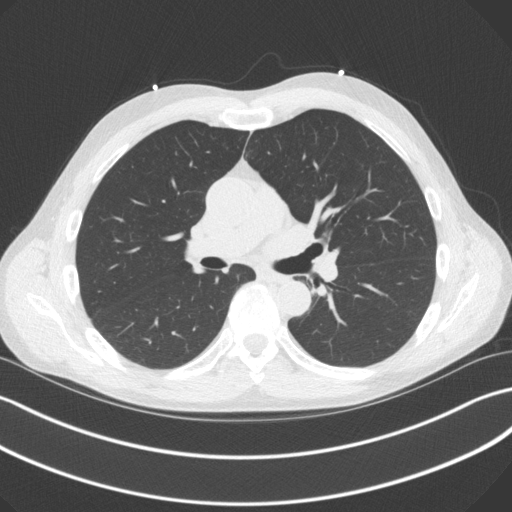

[Series 4: cascseq 2.0 br59 lung · axial · 0.74mm/px · z∈[-283,-183]mm · 5 of 76 slices shown]
[im 13/76  lung]
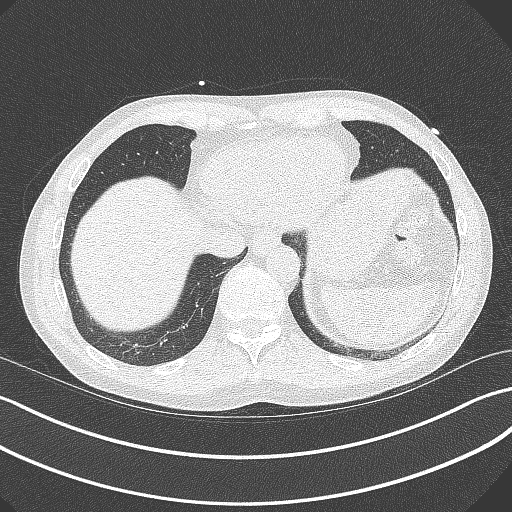
[im 26/76  lung]
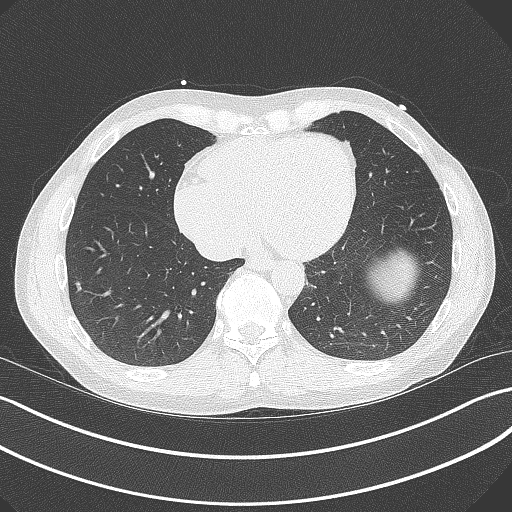
[im 38/76  lung]
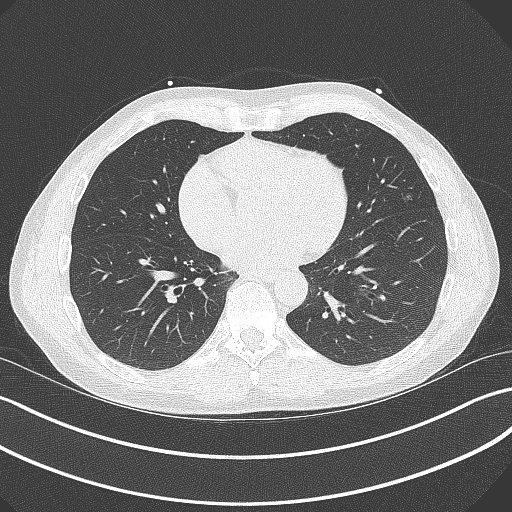
[im 51/76  lung]
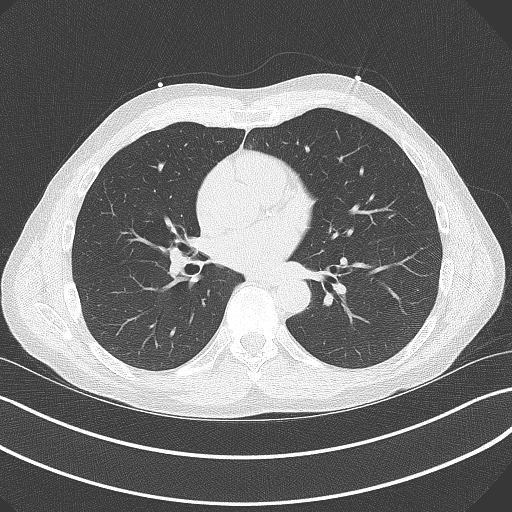
[im 63/76  lung]
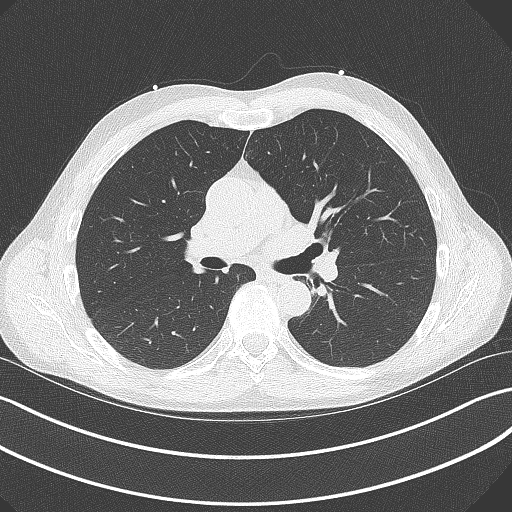

[14 of 20 positions shown; findings below may reference images not displayed]

FINDINGS: Scattered peribronchovascular nodularity and mucoid impaction in the
lungs, likely postinfectious in etiology. No suspicious pulmonary
nodules. Minimal dependent atelectasis or scarring.
IMPRESSION: No acute extracardiac findings.

ADDENDUM:
Cardiovascular Disease Risk stratification

EXAM:
Coronary Calcium Score
FINDINGS: Coronary arteries: Normal origins.

Coronary Calcium Score:

Left main: 0

Left anterior descending artery:

Left circumflex artery:

Right coronary artery:

Total: 148

Percentile: 69

Pericardium: Normal.

Ascending Aorta: Normal caliber.

Non-cardiac: See separate report from [REDACTED].
IMPRESSION: Coronary calcium score of 148. This was 69 percentile for age-,
race-, and sex-matched controls.



If CAC=0, it is reasonable to withhold statin therapy and reassess
in 5 to 10 years, as long as higher risk conditions are absent
(diabetes mellitus, family history of premature CHD in first degree
relatives (males <55 years; females <65 years), cigarette smoking,
or LDL >=190 mg/dL).

If CAC is 1 to 99, it is reasonable to initiate statin therapy for
patients >=55 years of age.

If CAC is >=100 or >=75th percentile, it is reasonable to initiate
statin therapy at any age.

Cardiology referral should be considered for patients with CAC
scores >=400 or >=75th percentile.

*6703 AHA/ACC/AACVPR/AAPA/ABC/OSMO/OLOGO/AUAD/Tc Ipek/KHADIJA/MELLOTT/HEINERT
Guideline on the Management of Blood Cholesterol: A Report of the
American College of Cardiology/American Heart Association Task Force
on Clinical Practice Guidelines. J Am Coll Cardiol.
6883;73(24):3210-3441.

Matjas Dassi, DO [REDACTED]

The noncardiac portion of this study will be interpreted in separate
report by the radiologist.

*** End of Addendum ***
EXAM:
OVER-READ INTERPRETATION  CT CHEST

The following report is an over-read performed by radiologist Dr.
Leroy Sol [REDACTED] on 09/09/2021. This
over-read does not include interpretation of cardiac or coronary
anatomy or pathology. The interpretation by the cardiologist is
attached.
FINDINGS: Scattered peribronchovascular nodularity and mucoid impaction in the
lungs, likely postinfectious in etiology. No suspicious pulmonary
nodules. Minimal dependent atelectasis or scarring.
IMPRESSION: No acute extracardiac findings.

## 2023-10-20 DIAGNOSIS — C61 Malignant neoplasm of prostate: Secondary | ICD-10-CM | POA: Diagnosis not present

## 2023-10-20 DIAGNOSIS — N5201 Erectile dysfunction due to arterial insufficiency: Secondary | ICD-10-CM | POA: Diagnosis not present

## 2023-10-21 DIAGNOSIS — J3089 Other allergic rhinitis: Secondary | ICD-10-CM | POA: Diagnosis not present

## 2023-10-21 DIAGNOSIS — J3081 Allergic rhinitis due to animal (cat) (dog) hair and dander: Secondary | ICD-10-CM | POA: Diagnosis not present

## 2023-10-21 DIAGNOSIS — J301 Allergic rhinitis due to pollen: Secondary | ICD-10-CM | POA: Diagnosis not present

## 2023-10-22 DIAGNOSIS — D225 Melanocytic nevi of trunk: Secondary | ICD-10-CM | POA: Diagnosis not present

## 2023-10-22 DIAGNOSIS — L814 Other melanin hyperpigmentation: Secondary | ICD-10-CM | POA: Diagnosis not present

## 2023-10-22 DIAGNOSIS — L570 Actinic keratosis: Secondary | ICD-10-CM | POA: Diagnosis not present

## 2023-10-22 DIAGNOSIS — L821 Other seborrheic keratosis: Secondary | ICD-10-CM | POA: Diagnosis not present

## 2023-10-22 DIAGNOSIS — L301 Dyshidrosis [pompholyx]: Secondary | ICD-10-CM | POA: Diagnosis not present

## 2023-10-28 DIAGNOSIS — J3081 Allergic rhinitis due to animal (cat) (dog) hair and dander: Secondary | ICD-10-CM | POA: Diagnosis not present

## 2023-10-28 DIAGNOSIS — J301 Allergic rhinitis due to pollen: Secondary | ICD-10-CM | POA: Diagnosis not present

## 2023-10-28 DIAGNOSIS — J3089 Other allergic rhinitis: Secondary | ICD-10-CM | POA: Diagnosis not present

## 2023-11-04 DIAGNOSIS — J3089 Other allergic rhinitis: Secondary | ICD-10-CM | POA: Diagnosis not present

## 2023-11-04 DIAGNOSIS — J3081 Allergic rhinitis due to animal (cat) (dog) hair and dander: Secondary | ICD-10-CM | POA: Diagnosis not present

## 2023-11-04 DIAGNOSIS — J301 Allergic rhinitis due to pollen: Secondary | ICD-10-CM | POA: Diagnosis not present

## 2023-11-09 DIAGNOSIS — J3089 Other allergic rhinitis: Secondary | ICD-10-CM | POA: Diagnosis not present

## 2023-11-09 DIAGNOSIS — J3081 Allergic rhinitis due to animal (cat) (dog) hair and dander: Secondary | ICD-10-CM | POA: Diagnosis not present

## 2023-11-09 DIAGNOSIS — J301 Allergic rhinitis due to pollen: Secondary | ICD-10-CM | POA: Diagnosis not present

## 2023-11-18 DIAGNOSIS — J3089 Other allergic rhinitis: Secondary | ICD-10-CM | POA: Diagnosis not present

## 2023-11-18 DIAGNOSIS — J301 Allergic rhinitis due to pollen: Secondary | ICD-10-CM | POA: Diagnosis not present

## 2023-11-18 DIAGNOSIS — J3081 Allergic rhinitis due to animal (cat) (dog) hair and dander: Secondary | ICD-10-CM | POA: Diagnosis not present

## 2023-11-25 DIAGNOSIS — J3081 Allergic rhinitis due to animal (cat) (dog) hair and dander: Secondary | ICD-10-CM | POA: Diagnosis not present

## 2023-11-25 DIAGNOSIS — J301 Allergic rhinitis due to pollen: Secondary | ICD-10-CM | POA: Diagnosis not present

## 2023-11-25 DIAGNOSIS — J3089 Other allergic rhinitis: Secondary | ICD-10-CM | POA: Diagnosis not present

## 2023-12-02 DIAGNOSIS — J3081 Allergic rhinitis due to animal (cat) (dog) hair and dander: Secondary | ICD-10-CM | POA: Diagnosis not present

## 2023-12-02 DIAGNOSIS — J3089 Other allergic rhinitis: Secondary | ICD-10-CM | POA: Diagnosis not present

## 2023-12-02 DIAGNOSIS — J301 Allergic rhinitis due to pollen: Secondary | ICD-10-CM | POA: Diagnosis not present

## 2023-12-10 DIAGNOSIS — J3081 Allergic rhinitis due to animal (cat) (dog) hair and dander: Secondary | ICD-10-CM | POA: Diagnosis not present

## 2023-12-10 DIAGNOSIS — J301 Allergic rhinitis due to pollen: Secondary | ICD-10-CM | POA: Diagnosis not present

## 2023-12-10 DIAGNOSIS — J3089 Other allergic rhinitis: Secondary | ICD-10-CM | POA: Diagnosis not present

## 2023-12-15 ENCOUNTER — Encounter: Payer: Self-pay | Admitting: *Deleted

## 2023-12-15 DIAGNOSIS — J3089 Other allergic rhinitis: Secondary | ICD-10-CM | POA: Diagnosis not present

## 2023-12-15 DIAGNOSIS — J301 Allergic rhinitis due to pollen: Secondary | ICD-10-CM | POA: Diagnosis not present

## 2023-12-23 DIAGNOSIS — J3081 Allergic rhinitis due to animal (cat) (dog) hair and dander: Secondary | ICD-10-CM | POA: Diagnosis not present

## 2023-12-23 DIAGNOSIS — J301 Allergic rhinitis due to pollen: Secondary | ICD-10-CM | POA: Diagnosis not present

## 2023-12-23 DIAGNOSIS — J3089 Other allergic rhinitis: Secondary | ICD-10-CM | POA: Diagnosis not present

## 2023-12-30 DIAGNOSIS — J301 Allergic rhinitis due to pollen: Secondary | ICD-10-CM | POA: Diagnosis not present

## 2023-12-30 DIAGNOSIS — J3089 Other allergic rhinitis: Secondary | ICD-10-CM | POA: Diagnosis not present

## 2024-01-08 DIAGNOSIS — J301 Allergic rhinitis due to pollen: Secondary | ICD-10-CM | POA: Diagnosis not present

## 2024-01-08 DIAGNOSIS — J3089 Other allergic rhinitis: Secondary | ICD-10-CM | POA: Diagnosis not present

## 2024-01-08 DIAGNOSIS — J3081 Allergic rhinitis due to animal (cat) (dog) hair and dander: Secondary | ICD-10-CM | POA: Diagnosis not present

## 2024-01-20 DIAGNOSIS — J301 Allergic rhinitis due to pollen: Secondary | ICD-10-CM | POA: Diagnosis not present

## 2024-01-20 DIAGNOSIS — J3081 Allergic rhinitis due to animal (cat) (dog) hair and dander: Secondary | ICD-10-CM | POA: Diagnosis not present

## 2024-01-20 DIAGNOSIS — J3089 Other allergic rhinitis: Secondary | ICD-10-CM | POA: Diagnosis not present

## 2024-01-21 ENCOUNTER — Encounter: Payer: Self-pay | Admitting: *Deleted

## 2024-01-21 ENCOUNTER — Inpatient Hospital Stay: Attending: Internal Medicine | Admitting: *Deleted

## 2024-01-21 DIAGNOSIS — C61 Malignant neoplasm of prostate: Secondary | ICD-10-CM

## 2024-01-21 NOTE — Progress Notes (Signed)
 SCP reviewed and completed. Post tx PSA was 3.35 in July. Pt will return in Jan 2026 for next PSA at [facility].

## 2024-01-26 DIAGNOSIS — J301 Allergic rhinitis due to pollen: Secondary | ICD-10-CM | POA: Diagnosis not present

## 2024-01-26 DIAGNOSIS — J3089 Other allergic rhinitis: Secondary | ICD-10-CM | POA: Diagnosis not present

## 2024-02-02 DIAGNOSIS — L309 Dermatitis, unspecified: Secondary | ICD-10-CM | POA: Diagnosis not present

## 2024-02-02 DIAGNOSIS — L218 Other seborrheic dermatitis: Secondary | ICD-10-CM | POA: Diagnosis not present

## 2024-02-02 DIAGNOSIS — D492 Neoplasm of unspecified behavior of bone, soft tissue, and skin: Secondary | ICD-10-CM | POA: Diagnosis not present

## 2024-02-03 DIAGNOSIS — J301 Allergic rhinitis due to pollen: Secondary | ICD-10-CM | POA: Diagnosis not present

## 2024-02-03 DIAGNOSIS — J3089 Other allergic rhinitis: Secondary | ICD-10-CM | POA: Diagnosis not present

## 2024-02-03 DIAGNOSIS — J3081 Allergic rhinitis due to animal (cat) (dog) hair and dander: Secondary | ICD-10-CM | POA: Diagnosis not present

## 2024-02-10 DIAGNOSIS — J3089 Other allergic rhinitis: Secondary | ICD-10-CM | POA: Diagnosis not present

## 2024-02-10 DIAGNOSIS — J301 Allergic rhinitis due to pollen: Secondary | ICD-10-CM | POA: Diagnosis not present

## 2024-02-17 DIAGNOSIS — J3089 Other allergic rhinitis: Secondary | ICD-10-CM | POA: Diagnosis not present

## 2024-02-17 DIAGNOSIS — J3081 Allergic rhinitis due to animal (cat) (dog) hair and dander: Secondary | ICD-10-CM | POA: Diagnosis not present

## 2024-02-17 DIAGNOSIS — J301 Allergic rhinitis due to pollen: Secondary | ICD-10-CM | POA: Diagnosis not present

## 2024-02-23 DIAGNOSIS — J301 Allergic rhinitis due to pollen: Secondary | ICD-10-CM | POA: Diagnosis not present

## 2024-02-23 DIAGNOSIS — J3089 Other allergic rhinitis: Secondary | ICD-10-CM | POA: Diagnosis not present

## 2024-02-29 DIAGNOSIS — J301 Allergic rhinitis due to pollen: Secondary | ICD-10-CM | POA: Diagnosis not present

## 2024-02-29 DIAGNOSIS — J3081 Allergic rhinitis due to animal (cat) (dog) hair and dander: Secondary | ICD-10-CM | POA: Diagnosis not present

## 2024-02-29 DIAGNOSIS — J3089 Other allergic rhinitis: Secondary | ICD-10-CM | POA: Diagnosis not present

## 2024-03-02 DIAGNOSIS — R21 Rash and other nonspecific skin eruption: Secondary | ICD-10-CM | POA: Diagnosis not present

## 2024-03-02 DIAGNOSIS — J3089 Other allergic rhinitis: Secondary | ICD-10-CM | POA: Diagnosis not present

## 2024-03-02 DIAGNOSIS — J4530 Mild persistent asthma, uncomplicated: Secondary | ICD-10-CM | POA: Diagnosis not present

## 2024-03-02 DIAGNOSIS — J301 Allergic rhinitis due to pollen: Secondary | ICD-10-CM | POA: Diagnosis not present
